# Patient Record
Sex: Female | Born: 1950 | Race: White | Hispanic: No | Marital: Single | Smoking: Never smoker
Health system: Southern US, Community
[De-identification: ages and names within clinical notes are randomized; demographics above are authoritative.]

## PROBLEM LIST (undated history)

## (undated) DIAGNOSIS — E785 Hyperlipidemia, unspecified: Secondary | ICD-10-CM

## (undated) DIAGNOSIS — M810 Age-related osteoporosis without current pathological fracture: Secondary | ICD-10-CM

## (undated) DIAGNOSIS — M109 Gout, unspecified: Secondary | ICD-10-CM

## (undated) DIAGNOSIS — E079 Disorder of thyroid, unspecified: Secondary | ICD-10-CM

## (undated) DIAGNOSIS — E669 Obesity, unspecified: Secondary | ICD-10-CM

## (undated) DIAGNOSIS — M21539 Acquired clawfoot, unspecified foot: Secondary | ICD-10-CM

## (undated) DIAGNOSIS — I10 Essential (primary) hypertension: Secondary | ICD-10-CM

## (undated) DIAGNOSIS — M1990 Unspecified osteoarthritis, unspecified site: Secondary | ICD-10-CM

## (undated) DIAGNOSIS — K859 Acute pancreatitis without necrosis or infection, unspecified: Secondary | ICD-10-CM

## (undated) DIAGNOSIS — K8020 Calculus of gallbladder without cholecystitis without obstruction: Secondary | ICD-10-CM

## (undated) DIAGNOSIS — E119 Type 2 diabetes mellitus without complications: Secondary | ICD-10-CM

## (undated) DIAGNOSIS — M75100 Unspecified rotator cuff tear or rupture of unspecified shoulder, not specified as traumatic: Secondary | ICD-10-CM

## (undated) DIAGNOSIS — M1991 Primary osteoarthritis, unspecified site: Secondary | ICD-10-CM

## (undated) DIAGNOSIS — I119 Hypertensive heart disease without heart failure: Secondary | ICD-10-CM

## (undated) DIAGNOSIS — N649 Disorder of breast, unspecified: Secondary | ICD-10-CM

## (undated) HISTORY — DX: Acquired clawfoot, unspecified foot: M21.539

## (undated) HISTORY — DX: Hypertensive heart disease without heart failure: I11.9

## (undated) HISTORY — DX: Disorder of breast, unspecified: N64.9

## (undated) HISTORY — DX: Hyperlipidemia, unspecified: E78.5

## (undated) HISTORY — DX: Obesity, unspecified: E66.9

## (undated) HISTORY — DX: Age-related osteoporosis without current pathological fracture: M81.0

## (undated) HISTORY — DX: Unspecified rotator cuff tear or rupture of unspecified shoulder, not specified as traumatic: M75.100

## (undated) HISTORY — DX: Hypercalcemia: E83.52

## (undated) HISTORY — DX: Primary osteoarthritis, unspecified site: M19.91

## (undated) HISTORY — PX: OTHER SURGICAL HISTORY: SHX169

---

## 1976-10-21 HISTORY — PX: APPENDECTOMY: SHX54

## 1978-10-21 HISTORY — PX: NASAL SEPTOPLASTY W/ TURBINOPLASTY: SHX2070

## 1996-10-21 HISTORY — PX: CHOLECYSTECTOMY: SHX55

## 1998-04-08 ENCOUNTER — Emergency Department (HOSPITAL_COMMUNITY): Admission: EM | Admit: 1998-04-08 | Discharge: 1998-04-08 | Payer: Self-pay | Admitting: Emergency Medicine

## 1999-09-05 ENCOUNTER — Other Ambulatory Visit: Admission: RE | Admit: 1999-09-05 | Discharge: 1999-09-05 | Payer: Self-pay | Admitting: *Deleted

## 2000-01-16 ENCOUNTER — Inpatient Hospital Stay (HOSPITAL_COMMUNITY): Admission: EM | Admit: 2000-01-16 | Discharge: 2000-01-21 | Payer: Self-pay | Admitting: *Deleted

## 2000-01-25 ENCOUNTER — Observation Stay (HOSPITAL_COMMUNITY): Admission: RE | Admit: 2000-01-25 | Discharge: 2000-01-26 | Payer: Self-pay | Admitting: General Surgery

## 2000-01-25 ENCOUNTER — Encounter: Payer: Self-pay | Admitting: General Surgery

## 2000-02-02 ENCOUNTER — Emergency Department (HOSPITAL_COMMUNITY): Admission: EM | Admit: 2000-02-02 | Discharge: 2000-02-02 | Payer: Self-pay | Admitting: Emergency Medicine

## 2000-12-18 ENCOUNTER — Observation Stay (HOSPITAL_COMMUNITY): Admission: EM | Admit: 2000-12-18 | Discharge: 2000-12-18 | Payer: Self-pay | Admitting: Emergency Medicine

## 2000-12-18 ENCOUNTER — Encounter: Payer: Self-pay | Admitting: Emergency Medicine

## 2001-01-19 ENCOUNTER — Encounter: Admission: RE | Admit: 2001-01-19 | Discharge: 2001-01-19 | Payer: Self-pay | Admitting: Internal Medicine

## 2003-03-22 HISTORY — PX: REPLACEMENT TOTAL KNEE: SUR1224

## 2015-07-22 HISTORY — PX: OTHER SURGICAL HISTORY: SHX169

## 2015-10-22 HISTORY — PX: JOINT REPLACEMENT: SHX530

## 2016-06-27 DIAGNOSIS — M150 Primary generalized (osteo)arthritis: Secondary | ICD-10-CM | POA: Diagnosis not present

## 2016-06-27 DIAGNOSIS — M7662 Achilles tendinitis, left leg: Secondary | ICD-10-CM | POA: Diagnosis not present

## 2016-06-27 DIAGNOSIS — M2550 Pain in unspecified joint: Secondary | ICD-10-CM | POA: Diagnosis not present

## 2016-06-27 DIAGNOSIS — E790 Hyperuricemia without signs of inflammatory arthritis and tophaceous disease: Secondary | ICD-10-CM | POA: Diagnosis not present

## 2016-06-27 DIAGNOSIS — M0609 Rheumatoid arthritis without rheumatoid factor, multiple sites: Secondary | ICD-10-CM | POA: Diagnosis not present

## 2016-09-07 DIAGNOSIS — M79642 Pain in left hand: Secondary | ICD-10-CM | POA: Diagnosis not present

## 2016-09-07 DIAGNOSIS — M19041 Primary osteoarthritis, right hand: Secondary | ICD-10-CM | POA: Diagnosis not present

## 2016-09-07 DIAGNOSIS — M654 Radial styloid tenosynovitis [de Quervain]: Secondary | ICD-10-CM | POA: Diagnosis not present

## 2016-09-07 DIAGNOSIS — M1812 Unilateral primary osteoarthritis of first carpometacarpal joint, left hand: Secondary | ICD-10-CM | POA: Diagnosis not present

## 2016-12-26 DIAGNOSIS — E669 Obesity, unspecified: Secondary | ICD-10-CM | POA: Diagnosis not present

## 2016-12-26 DIAGNOSIS — M150 Primary generalized (osteo)arthritis: Secondary | ICD-10-CM | POA: Diagnosis not present

## 2016-12-26 DIAGNOSIS — M7662 Achilles tendinitis, left leg: Secondary | ICD-10-CM | POA: Diagnosis not present

## 2016-12-26 DIAGNOSIS — Z6835 Body mass index (BMI) 35.0-35.9, adult: Secondary | ICD-10-CM | POA: Diagnosis not present

## 2016-12-26 DIAGNOSIS — M2550 Pain in unspecified joint: Secondary | ICD-10-CM | POA: Diagnosis not present

## 2016-12-26 DIAGNOSIS — E790 Hyperuricemia without signs of inflammatory arthritis and tophaceous disease: Secondary | ICD-10-CM | POA: Diagnosis not present

## 2016-12-26 DIAGNOSIS — M0609 Rheumatoid arthritis without rheumatoid factor, multiple sites: Secondary | ICD-10-CM | POA: Diagnosis not present

## 2017-01-08 DIAGNOSIS — M25341 Other instability, right hand: Secondary | ICD-10-CM | POA: Diagnosis not present

## 2017-01-08 DIAGNOSIS — M19042 Primary osteoarthritis, left hand: Secondary | ICD-10-CM | POA: Diagnosis not present

## 2017-01-08 DIAGNOSIS — M19041 Primary osteoarthritis, right hand: Secondary | ICD-10-CM | POA: Diagnosis not present

## 2017-01-08 DIAGNOSIS — M1812 Unilateral primary osteoarthritis of first carpometacarpal joint, left hand: Secondary | ICD-10-CM | POA: Diagnosis not present

## 2017-03-26 DIAGNOSIS — M0609 Rheumatoid arthritis without rheumatoid factor, multiple sites: Secondary | ICD-10-CM | POA: Diagnosis not present

## 2017-03-26 DIAGNOSIS — M7662 Achilles tendinitis, left leg: Secondary | ICD-10-CM | POA: Diagnosis not present

## 2017-03-26 DIAGNOSIS — M150 Primary generalized (osteo)arthritis: Secondary | ICD-10-CM | POA: Diagnosis not present

## 2017-03-26 DIAGNOSIS — M2550 Pain in unspecified joint: Secondary | ICD-10-CM | POA: Diagnosis not present

## 2017-03-26 DIAGNOSIS — E790 Hyperuricemia without signs of inflammatory arthritis and tophaceous disease: Secondary | ICD-10-CM | POA: Diagnosis not present

## 2017-03-26 DIAGNOSIS — E669 Obesity, unspecified: Secondary | ICD-10-CM | POA: Diagnosis not present

## 2017-03-26 DIAGNOSIS — Z6835 Body mass index (BMI) 35.0-35.9, adult: Secondary | ICD-10-CM | POA: Diagnosis not present

## 2017-03-28 DIAGNOSIS — M0609 Rheumatoid arthritis without rheumatoid factor, multiple sites: Secondary | ICD-10-CM | POA: Diagnosis not present

## 2017-04-25 DIAGNOSIS — M7661 Achilles tendinitis, right leg: Secondary | ICD-10-CM | POA: Diagnosis not present

## 2017-05-05 DIAGNOSIS — M25571 Pain in right ankle and joints of right foot: Secondary | ICD-10-CM | POA: Diagnosis not present

## 2017-05-07 DIAGNOSIS — M25571 Pain in right ankle and joints of right foot: Secondary | ICD-10-CM | POA: Diagnosis not present

## 2017-05-09 DIAGNOSIS — M25571 Pain in right ankle and joints of right foot: Secondary | ICD-10-CM | POA: Diagnosis not present

## 2017-05-12 DIAGNOSIS — M25571 Pain in right ankle and joints of right foot: Secondary | ICD-10-CM | POA: Diagnosis not present

## 2017-05-14 DIAGNOSIS — M25571 Pain in right ankle and joints of right foot: Secondary | ICD-10-CM | POA: Diagnosis not present

## 2017-05-16 DIAGNOSIS — M25571 Pain in right ankle and joints of right foot: Secondary | ICD-10-CM | POA: Diagnosis not present

## 2017-05-16 DIAGNOSIS — M7662 Achilles tendinitis, left leg: Secondary | ICD-10-CM | POA: Diagnosis not present

## 2017-05-19 DIAGNOSIS — M25571 Pain in right ankle and joints of right foot: Secondary | ICD-10-CM | POA: Diagnosis not present

## 2017-05-21 DIAGNOSIS — M25571 Pain in right ankle and joints of right foot: Secondary | ICD-10-CM | POA: Diagnosis not present

## 2017-05-23 DIAGNOSIS — M25571 Pain in right ankle and joints of right foot: Secondary | ICD-10-CM | POA: Diagnosis not present

## 2017-05-26 DIAGNOSIS — M25571 Pain in right ankle and joints of right foot: Secondary | ICD-10-CM | POA: Diagnosis not present

## 2017-05-26 DIAGNOSIS — E669 Obesity, unspecified: Secondary | ICD-10-CM | POA: Diagnosis not present

## 2017-05-26 DIAGNOSIS — E790 Hyperuricemia without signs of inflammatory arthritis and tophaceous disease: Secondary | ICD-10-CM | POA: Diagnosis not present

## 2017-05-26 DIAGNOSIS — M79674 Pain in right toe(s): Secondary | ICD-10-CM | POA: Diagnosis not present

## 2017-05-26 DIAGNOSIS — M150 Primary generalized (osteo)arthritis: Secondary | ICD-10-CM | POA: Diagnosis not present

## 2017-05-26 DIAGNOSIS — Z6834 Body mass index (BMI) 34.0-34.9, adult: Secondary | ICD-10-CM | POA: Diagnosis not present

## 2017-05-26 DIAGNOSIS — M2550 Pain in unspecified joint: Secondary | ICD-10-CM | POA: Diagnosis not present

## 2017-05-26 DIAGNOSIS — M0609 Rheumatoid arthritis without rheumatoid factor, multiple sites: Secondary | ICD-10-CM | POA: Diagnosis not present

## 2017-05-26 DIAGNOSIS — M7662 Achilles tendinitis, left leg: Secondary | ICD-10-CM | POA: Diagnosis not present

## 2017-05-28 DIAGNOSIS — M25571 Pain in right ankle and joints of right foot: Secondary | ICD-10-CM | POA: Diagnosis not present

## 2017-05-30 DIAGNOSIS — M25571 Pain in right ankle and joints of right foot: Secondary | ICD-10-CM | POA: Diagnosis not present

## 2017-06-02 DIAGNOSIS — M25571 Pain in right ankle and joints of right foot: Secondary | ICD-10-CM | POA: Diagnosis not present

## 2017-06-06 DIAGNOSIS — M25571 Pain in right ankle and joints of right foot: Secondary | ICD-10-CM | POA: Diagnosis not present

## 2017-06-09 DIAGNOSIS — M25571 Pain in right ankle and joints of right foot: Secondary | ICD-10-CM | POA: Diagnosis not present

## 2017-06-11 DIAGNOSIS — M25571 Pain in right ankle and joints of right foot: Secondary | ICD-10-CM | POA: Diagnosis not present

## 2017-06-13 DIAGNOSIS — M25571 Pain in right ankle and joints of right foot: Secondary | ICD-10-CM | POA: Diagnosis not present

## 2017-06-27 DIAGNOSIS — M7662 Achilles tendinitis, left leg: Secondary | ICD-10-CM | POA: Diagnosis not present

## 2017-06-27 DIAGNOSIS — M7661 Achilles tendinitis, right leg: Secondary | ICD-10-CM | POA: Diagnosis not present

## 2017-06-30 DIAGNOSIS — M0609 Rheumatoid arthritis without rheumatoid factor, multiple sites: Secondary | ICD-10-CM | POA: Diagnosis not present

## 2017-06-30 DIAGNOSIS — M150 Primary generalized (osteo)arthritis: Secondary | ICD-10-CM | POA: Diagnosis not present

## 2017-06-30 DIAGNOSIS — E790 Hyperuricemia without signs of inflammatory arthritis and tophaceous disease: Secondary | ICD-10-CM | POA: Diagnosis not present

## 2017-06-30 DIAGNOSIS — Z6835 Body mass index (BMI) 35.0-35.9, adult: Secondary | ICD-10-CM | POA: Diagnosis not present

## 2017-06-30 DIAGNOSIS — M2550 Pain in unspecified joint: Secondary | ICD-10-CM | POA: Diagnosis not present

## 2017-06-30 DIAGNOSIS — M7662 Achilles tendinitis, left leg: Secondary | ICD-10-CM | POA: Diagnosis not present

## 2017-06-30 DIAGNOSIS — M79674 Pain in right toe(s): Secondary | ICD-10-CM | POA: Diagnosis not present

## 2017-06-30 DIAGNOSIS — E669 Obesity, unspecified: Secondary | ICD-10-CM | POA: Diagnosis not present

## 2017-12-29 DIAGNOSIS — M2550 Pain in unspecified joint: Secondary | ICD-10-CM | POA: Diagnosis not present

## 2017-12-29 DIAGNOSIS — M0609 Rheumatoid arthritis without rheumatoid factor, multiple sites: Secondary | ICD-10-CM | POA: Diagnosis not present

## 2017-12-29 DIAGNOSIS — E790 Hyperuricemia without signs of inflammatory arthritis and tophaceous disease: Secondary | ICD-10-CM | POA: Diagnosis not present

## 2017-12-29 DIAGNOSIS — E669 Obesity, unspecified: Secondary | ICD-10-CM | POA: Diagnosis not present

## 2017-12-29 DIAGNOSIS — M7662 Achilles tendinitis, left leg: Secondary | ICD-10-CM | POA: Diagnosis not present

## 2017-12-29 DIAGNOSIS — Z6835 Body mass index (BMI) 35.0-35.9, adult: Secondary | ICD-10-CM | POA: Diagnosis not present

## 2017-12-29 DIAGNOSIS — M150 Primary generalized (osteo)arthritis: Secondary | ICD-10-CM | POA: Diagnosis not present

## 2018-02-18 ENCOUNTER — Other Ambulatory Visit: Payer: Self-pay | Admitting: Internal Medicine

## 2018-02-18 DIAGNOSIS — M1990 Unspecified osteoarthritis, unspecified site: Secondary | ICD-10-CM

## 2018-02-18 DIAGNOSIS — M25512 Pain in left shoulder: Secondary | ICD-10-CM

## 2018-03-09 DIAGNOSIS — M25522 Pain in left elbow: Secondary | ICD-10-CM | POA: Diagnosis not present

## 2018-03-09 DIAGNOSIS — S52132A Displaced fracture of neck of left radius, initial encounter for closed fracture: Secondary | ICD-10-CM | POA: Diagnosis not present

## 2018-03-09 DIAGNOSIS — M25532 Pain in left wrist: Secondary | ICD-10-CM | POA: Diagnosis not present

## 2018-03-11 ENCOUNTER — Ambulatory Visit
Admission: RE | Admit: 2018-03-11 | Discharge: 2018-03-11 | Disposition: A | Discharge status: Home / Self Care | Payer: Self-pay | Source: Ambulatory Visit | Attending: Internal Medicine | Admitting: Internal Medicine

## 2018-03-11 DIAGNOSIS — M19012 Primary osteoarthritis, left shoulder: Secondary | ICD-10-CM | POA: Diagnosis not present

## 2018-03-11 DIAGNOSIS — M25532 Pain in left wrist: Secondary | ICD-10-CM | POA: Diagnosis not present

## 2018-03-11 DIAGNOSIS — M25512 Pain in left shoulder: Secondary | ICD-10-CM

## 2018-03-11 DIAGNOSIS — M1990 Unspecified osteoarthritis, unspecified site: Secondary | ICD-10-CM

## 2018-03-11 DIAGNOSIS — S52135D Nondisplaced fracture of neck of left radius, subsequent encounter for closed fracture with routine healing: Secondary | ICD-10-CM | POA: Diagnosis not present

## 2018-03-11 DIAGNOSIS — M25522 Pain in left elbow: Secondary | ICD-10-CM | POA: Diagnosis not present

## 2018-03-25 DIAGNOSIS — M25522 Pain in left elbow: Secondary | ICD-10-CM | POA: Diagnosis not present

## 2018-03-25 DIAGNOSIS — M25532 Pain in left wrist: Secondary | ICD-10-CM | POA: Diagnosis not present

## 2018-03-25 DIAGNOSIS — S52135D Nondisplaced fracture of neck of left radius, subsequent encounter for closed fracture with routine healing: Secondary | ICD-10-CM | POA: Diagnosis not present

## 2018-03-30 DIAGNOSIS — M25522 Pain in left elbow: Secondary | ICD-10-CM | POA: Diagnosis not present

## 2018-03-30 DIAGNOSIS — M25532 Pain in left wrist: Secondary | ICD-10-CM | POA: Diagnosis not present

## 2018-03-30 DIAGNOSIS — S52135D Nondisplaced fracture of neck of left radius, subsequent encounter for closed fracture with routine healing: Secondary | ICD-10-CM | POA: Diagnosis not present

## 2018-03-30 DIAGNOSIS — S52133A Displaced fracture of neck of unspecified radius, initial encounter for closed fracture: Secondary | ICD-10-CM | POA: Insufficient documentation

## 2018-04-01 DIAGNOSIS — M25532 Pain in left wrist: Secondary | ICD-10-CM | POA: Diagnosis not present

## 2018-04-01 DIAGNOSIS — M25522 Pain in left elbow: Secondary | ICD-10-CM | POA: Diagnosis not present

## 2018-04-01 DIAGNOSIS — S52135D Nondisplaced fracture of neck of left radius, subsequent encounter for closed fracture with routine healing: Secondary | ICD-10-CM | POA: Diagnosis not present

## 2018-04-06 DIAGNOSIS — S52135D Nondisplaced fracture of neck of left radius, subsequent encounter for closed fracture with routine healing: Secondary | ICD-10-CM | POA: Diagnosis not present

## 2018-04-06 DIAGNOSIS — M25522 Pain in left elbow: Secondary | ICD-10-CM | POA: Diagnosis not present

## 2018-04-06 DIAGNOSIS — M25532 Pain in left wrist: Secondary | ICD-10-CM | POA: Diagnosis not present

## 2018-04-08 DIAGNOSIS — S52135D Nondisplaced fracture of neck of left radius, subsequent encounter for closed fracture with routine healing: Secondary | ICD-10-CM | POA: Diagnosis not present

## 2018-04-08 DIAGNOSIS — M25532 Pain in left wrist: Secondary | ICD-10-CM | POA: Diagnosis not present

## 2018-04-08 DIAGNOSIS — M25522 Pain in left elbow: Secondary | ICD-10-CM | POA: Diagnosis not present

## 2018-04-13 DIAGNOSIS — M25522 Pain in left elbow: Secondary | ICD-10-CM | POA: Diagnosis not present

## 2018-04-13 DIAGNOSIS — S52135D Nondisplaced fracture of neck of left radius, subsequent encounter for closed fracture with routine healing: Secondary | ICD-10-CM | POA: Diagnosis not present

## 2018-04-13 DIAGNOSIS — M25532 Pain in left wrist: Secondary | ICD-10-CM | POA: Diagnosis not present

## 2018-04-15 DIAGNOSIS — M25522 Pain in left elbow: Secondary | ICD-10-CM | POA: Diagnosis not present

## 2018-04-15 DIAGNOSIS — S52135D Nondisplaced fracture of neck of left radius, subsequent encounter for closed fracture with routine healing: Secondary | ICD-10-CM | POA: Diagnosis not present

## 2018-04-15 DIAGNOSIS — M25532 Pain in left wrist: Secondary | ICD-10-CM | POA: Diagnosis not present

## 2018-04-22 DIAGNOSIS — M25522 Pain in left elbow: Secondary | ICD-10-CM | POA: Diagnosis not present

## 2018-04-29 DIAGNOSIS — M25532 Pain in left wrist: Secondary | ICD-10-CM | POA: Diagnosis not present

## 2018-04-29 DIAGNOSIS — M25522 Pain in left elbow: Secondary | ICD-10-CM | POA: Diagnosis not present

## 2018-04-29 DIAGNOSIS — S52135D Nondisplaced fracture of neck of left radius, subsequent encounter for closed fracture with routine healing: Secondary | ICD-10-CM | POA: Diagnosis not present

## 2018-05-04 DIAGNOSIS — M25522 Pain in left elbow: Secondary | ICD-10-CM | POA: Diagnosis not present

## 2018-05-04 DIAGNOSIS — S52135D Nondisplaced fracture of neck of left radius, subsequent encounter for closed fracture with routine healing: Secondary | ICD-10-CM | POA: Diagnosis not present

## 2018-05-04 DIAGNOSIS — M25532 Pain in left wrist: Secondary | ICD-10-CM | POA: Diagnosis not present

## 2018-05-06 DIAGNOSIS — M25522 Pain in left elbow: Secondary | ICD-10-CM | POA: Diagnosis not present

## 2018-05-06 DIAGNOSIS — S52135D Nondisplaced fracture of neck of left radius, subsequent encounter for closed fracture with routine healing: Secondary | ICD-10-CM | POA: Diagnosis not present

## 2018-05-06 DIAGNOSIS — M25532 Pain in left wrist: Secondary | ICD-10-CM | POA: Diagnosis not present

## 2018-05-11 DIAGNOSIS — S52135D Nondisplaced fracture of neck of left radius, subsequent encounter for closed fracture with routine healing: Secondary | ICD-10-CM | POA: Diagnosis not present

## 2018-05-11 DIAGNOSIS — M25522 Pain in left elbow: Secondary | ICD-10-CM | POA: Diagnosis not present

## 2018-05-11 DIAGNOSIS — M25532 Pain in left wrist: Secondary | ICD-10-CM | POA: Diagnosis not present

## 2018-05-13 DIAGNOSIS — M25532 Pain in left wrist: Secondary | ICD-10-CM | POA: Diagnosis not present

## 2018-05-13 DIAGNOSIS — M25522 Pain in left elbow: Secondary | ICD-10-CM | POA: Diagnosis not present

## 2018-05-13 DIAGNOSIS — S52135D Nondisplaced fracture of neck of left radius, subsequent encounter for closed fracture with routine healing: Secondary | ICD-10-CM | POA: Diagnosis not present

## 2018-05-18 DIAGNOSIS — S52135D Nondisplaced fracture of neck of left radius, subsequent encounter for closed fracture with routine healing: Secondary | ICD-10-CM | POA: Diagnosis not present

## 2018-05-18 DIAGNOSIS — M25522 Pain in left elbow: Secondary | ICD-10-CM | POA: Diagnosis not present

## 2018-05-18 DIAGNOSIS — M25532 Pain in left wrist: Secondary | ICD-10-CM | POA: Diagnosis not present

## 2018-05-21 DIAGNOSIS — M25522 Pain in left elbow: Secondary | ICD-10-CM | POA: Diagnosis not present

## 2018-05-21 DIAGNOSIS — M25532 Pain in left wrist: Secondary | ICD-10-CM | POA: Diagnosis not present

## 2018-05-21 DIAGNOSIS — S52135D Nondisplaced fracture of neck of left radius, subsequent encounter for closed fracture with routine healing: Secondary | ICD-10-CM | POA: Diagnosis not present

## 2018-05-25 DIAGNOSIS — M25532 Pain in left wrist: Secondary | ICD-10-CM | POA: Diagnosis not present

## 2018-05-25 DIAGNOSIS — M25522 Pain in left elbow: Secondary | ICD-10-CM | POA: Diagnosis not present

## 2018-05-25 DIAGNOSIS — S52135D Nondisplaced fracture of neck of left radius, subsequent encounter for closed fracture with routine healing: Secondary | ICD-10-CM | POA: Diagnosis not present

## 2018-07-01 DIAGNOSIS — M2550 Pain in unspecified joint: Secondary | ICD-10-CM | POA: Diagnosis not present

## 2018-07-01 DIAGNOSIS — Z6835 Body mass index (BMI) 35.0-35.9, adult: Secondary | ICD-10-CM | POA: Diagnosis not present

## 2018-07-01 DIAGNOSIS — E790 Hyperuricemia without signs of inflammatory arthritis and tophaceous disease: Secondary | ICD-10-CM | POA: Diagnosis not present

## 2018-07-01 DIAGNOSIS — M150 Primary generalized (osteo)arthritis: Secondary | ICD-10-CM | POA: Diagnosis not present

## 2018-07-01 DIAGNOSIS — E669 Obesity, unspecified: Secondary | ICD-10-CM | POA: Diagnosis not present

## 2018-07-01 DIAGNOSIS — M0609 Rheumatoid arthritis without rheumatoid factor, multiple sites: Secondary | ICD-10-CM | POA: Diagnosis not present

## 2018-07-01 DIAGNOSIS — M7662 Achilles tendinitis, left leg: Secondary | ICD-10-CM | POA: Diagnosis not present

## 2018-07-23 DIAGNOSIS — M0609 Rheumatoid arthritis without rheumatoid factor, multiple sites: Secondary | ICD-10-CM | POA: Diagnosis not present

## 2018-07-23 DIAGNOSIS — M2550 Pain in unspecified joint: Secondary | ICD-10-CM | POA: Diagnosis not present

## 2018-07-23 DIAGNOSIS — M7662 Achilles tendinitis, left leg: Secondary | ICD-10-CM | POA: Diagnosis not present

## 2018-07-23 DIAGNOSIS — E790 Hyperuricemia without signs of inflammatory arthritis and tophaceous disease: Secondary | ICD-10-CM | POA: Diagnosis not present

## 2018-07-23 DIAGNOSIS — M79671 Pain in right foot: Secondary | ICD-10-CM | POA: Diagnosis not present

## 2018-07-23 DIAGNOSIS — M150 Primary generalized (osteo)arthritis: Secondary | ICD-10-CM | POA: Diagnosis not present

## 2019-09-27 DIAGNOSIS — M109 Gout, unspecified: Secondary | ICD-10-CM | POA: Insufficient documentation

## 2019-10-26 HISTORY — PX: OTHER SURGICAL HISTORY: SHX169

## 2019-10-30 ENCOUNTER — Emergency Department (HOSPITAL_COMMUNITY): Payer: Medicare PPO

## 2019-10-30 ENCOUNTER — Inpatient Hospital Stay (HOSPITAL_COMMUNITY)
Admission: EM | Admit: 2019-10-30 | Discharge: 2019-11-03 | DRG: 069 | Disposition: A | Discharge status: Home Health | Payer: Medicare PPO | Attending: Internal Medicine | Admitting: Internal Medicine

## 2019-10-30 ENCOUNTER — Ambulatory Visit (HOSPITAL_COMMUNITY)
Admission: EM | Admit: 2019-10-30 | Discharge: 2019-10-30 | Disposition: A | Discharge status: Home / Self Care | Payer: Self-pay

## 2019-10-30 ENCOUNTER — Encounter (HOSPITAL_COMMUNITY): Payer: Self-pay

## 2019-10-30 ENCOUNTER — Other Ambulatory Visit: Payer: Self-pay

## 2019-10-30 DIAGNOSIS — E785 Hyperlipidemia, unspecified: Secondary | ICD-10-CM | POA: Diagnosis present

## 2019-10-30 DIAGNOSIS — R202 Paresthesia of skin: Secondary | ICD-10-CM

## 2019-10-30 DIAGNOSIS — Z20822 Contact with and (suspected) exposure to covid-19: Secondary | ICD-10-CM | POA: Diagnosis present

## 2019-10-30 DIAGNOSIS — E1159 Type 2 diabetes mellitus with other circulatory complications: Secondary | ICD-10-CM

## 2019-10-30 DIAGNOSIS — M50222 Other cervical disc displacement at C5-C6 level: Secondary | ICD-10-CM | POA: Diagnosis present

## 2019-10-30 DIAGNOSIS — Z882 Allergy status to sulfonamides status: Secondary | ICD-10-CM

## 2019-10-30 DIAGNOSIS — R260 Ataxic gait: Secondary | ICD-10-CM | POA: Diagnosis present

## 2019-10-30 DIAGNOSIS — M069 Rheumatoid arthritis, unspecified: Secondary | ICD-10-CM | POA: Diagnosis present

## 2019-10-30 DIAGNOSIS — M1990 Unspecified osteoarthritis, unspecified site: Secondary | ICD-10-CM | POA: Diagnosis present

## 2019-10-30 DIAGNOSIS — Z886 Allergy status to analgesic agent status: Secondary | ICD-10-CM

## 2019-10-30 DIAGNOSIS — M4802 Spinal stenosis, cervical region: Secondary | ICD-10-CM | POA: Diagnosis present

## 2019-10-30 DIAGNOSIS — M47812 Spondylosis without myelopathy or radiculopathy, cervical region: Secondary | ICD-10-CM | POA: Diagnosis present

## 2019-10-30 DIAGNOSIS — R519 Headache, unspecified: Secondary | ICD-10-CM | POA: Diagnosis present

## 2019-10-30 DIAGNOSIS — R42 Dizziness and giddiness: Secondary | ICD-10-CM

## 2019-10-30 DIAGNOSIS — M109 Gout, unspecified: Secondary | ICD-10-CM | POA: Diagnosis present

## 2019-10-30 DIAGNOSIS — Z885 Allergy status to narcotic agent status: Secondary | ICD-10-CM

## 2019-10-30 DIAGNOSIS — R200 Anesthesia of skin: Secondary | ICD-10-CM | POA: Diagnosis not present

## 2019-10-30 DIAGNOSIS — G43909 Migraine, unspecified, not intractable, without status migrainosus: Secondary | ICD-10-CM | POA: Diagnosis present

## 2019-10-30 DIAGNOSIS — I10 Essential (primary) hypertension: Secondary | ICD-10-CM | POA: Diagnosis present

## 2019-10-30 DIAGNOSIS — Z888 Allergy status to other drugs, medicaments and biological substances status: Secondary | ICD-10-CM

## 2019-10-30 DIAGNOSIS — R262 Difficulty in walking, not elsewhere classified: Secondary | ICD-10-CM | POA: Diagnosis present

## 2019-10-30 DIAGNOSIS — Z79890 Hormone replacement therapy (postmenopausal): Secondary | ICD-10-CM

## 2019-10-30 DIAGNOSIS — G459 Transient cerebral ischemic attack, unspecified: Principal | ICD-10-CM | POA: Diagnosis present

## 2019-10-30 DIAGNOSIS — I152 Hypertension secondary to endocrine disorders: Secondary | ICD-10-CM

## 2019-10-30 DIAGNOSIS — E039 Hypothyroidism, unspecified: Secondary | ICD-10-CM | POA: Diagnosis present

## 2019-10-30 DIAGNOSIS — E119 Type 2 diabetes mellitus without complications: Secondary | ICD-10-CM | POA: Diagnosis present

## 2019-10-30 DIAGNOSIS — Z881 Allergy status to other antibiotic agents status: Secondary | ICD-10-CM

## 2019-10-30 DIAGNOSIS — R270 Ataxia, unspecified: Secondary | ICD-10-CM

## 2019-10-30 DIAGNOSIS — Z880 Allergy status to penicillin: Secondary | ICD-10-CM

## 2019-10-30 HISTORY — DX: Type 2 diabetes mellitus without complications: E11.9

## 2019-10-30 HISTORY — DX: Disorder of thyroid, unspecified: E07.9

## 2019-10-30 HISTORY — DX: Gout, unspecified: M10.9

## 2019-10-30 HISTORY — DX: Acute pancreatitis without necrosis or infection, unspecified: K85.90

## 2019-10-30 HISTORY — DX: Essential (primary) hypertension: I10

## 2019-10-30 HISTORY — DX: Unspecified osteoarthritis, unspecified site: M19.90

## 2019-10-30 HISTORY — DX: Calculus of gallbladder without cholecystitis without obstruction: K80.20

## 2019-10-30 LAB — ETHANOL: Alcohol, Ethyl (B): <10 mg/dL (ref <10)

## 2019-10-30 LAB — CBC WITH DIFFERENTIAL/PLATELET
Abs Immature Granulocytes: 0.05 10*3/uL (ref 0.00–0.07)
Basophils Absolute: 0.1 10*3/uL (ref 0.0–0.1)
Basophils Relative: 1 %
Eosinophils Absolute: 0.2 10*3/uL (ref 0.0–0.5)
Eosinophils Relative: 2 %
HCT: 44.8 % (ref 36.0–46.0)
Hemoglobin: 14.6 g/dL (ref 12.0–15.0)
Immature Granulocytes: 1 %
Lymphocytes Relative: 16 %
Lymphs Abs: 1.5 10*3/uL (ref 0.7–4.0)
MCH: 28.7 pg (ref 26.0–34.0)
MCHC: 32.6 g/dL (ref 30.0–36.0)
MCV: 88.2 fL (ref 80.0–100.0)
Monocytes Absolute: 0.6 10*3/uL (ref 0.1–1.0)
Monocytes Relative: 6 %
Neutro Abs: 6.8 10*3/uL (ref 1.7–7.7)
Neutrophils Relative %: 74 %
Platelets: 235 10*3/uL (ref 150–400)
RBC: 5.08 MIL/uL (ref 3.87–5.11)
RDW: 12.8 % (ref 11.5–15.5)
WBC: 9.1 10*3/uL (ref 4.0–10.5)
nRBC: 0 % (ref 0.0–0.2)

## 2019-10-30 LAB — URINALYSIS, ROUTINE W REFLEX MICROSCOPIC
Bacteria, UA: NONE SEEN
Bilirubin Urine: NEGATIVE
Glucose, UA: NEGATIVE mg/dL
Ketones, ur: NEGATIVE mg/dL
Nitrite: POSITIVE — AB
Protein, ur: 30 mg/dL — AB
Specific Gravity, Urine: 1.016 (ref 1.005–1.030)
pH: 5 (ref 5.0–8.0)

## 2019-10-30 LAB — PROTIME-INR
INR: 1 (ref 0.8–1.2)
Prothrombin Time: 12.7 seconds (ref 11.4–15.2)

## 2019-10-30 LAB — I-STAT CHEM 8, ED
BUN: 14 mg/dL (ref 8–23)
Calcium, Ion: 1.14 mmol/L — ABNORMAL LOW (ref 1.15–1.40)
Chloride: 98 mmol/L (ref 98–111)
Creatinine, Ser: 0.7 mg/dL (ref 0.44–1.00)
Glucose, Bld: 134 mg/dL — ABNORMAL HIGH (ref 70–99)
HCT: 45 % (ref 36.0–46.0)
Hemoglobin: 15.3 g/dL — ABNORMAL HIGH (ref 12.0–15.0)
Potassium: 3.9 mmol/L (ref 3.5–5.1)
Sodium: 136 mmol/L (ref 135–145)
TCO2: 29 mmol/L (ref 22–32)

## 2019-10-30 LAB — APTT: aPTT: 28 seconds (ref 24–36)

## 2019-10-30 LAB — CBG MONITORING, ED: Glucose-Capillary: 128 mg/dL — ABNORMAL HIGH (ref 70–99)

## 2019-10-30 LAB — COMPREHENSIVE METABOLIC PANEL
ALT: 26 U/L (ref 0–44)
AST: 21 U/L (ref 15–41)
Albumin: 4.2 g/dL (ref 3.5–5.0)
Alkaline Phosphatase: 75 U/L (ref 38–126)
Anion gap: 13 (ref 5–15)
BUN: 12 mg/dL (ref 8–23)
CO2: 27 mmol/L (ref 22–32)
Calcium: 9.7 mg/dL (ref 8.9–10.3)
Chloride: 95 mmol/L — ABNORMAL LOW (ref 98–111)
Creatinine, Ser: 0.72 mg/dL (ref 0.44–1.00)
GFR calc Af Amer: >60 mL/min (ref >60)
GFR calc non Af Amer: >60 mL/min (ref >60)
Glucose, Bld: 139 mg/dL — ABNORMAL HIGH (ref 70–99)
Potassium: 3.7 mmol/L (ref 3.5–5.1)
Sodium: 135 mmol/L (ref 135–145)
Total Bilirubin: 0.8 mg/dL (ref 0.3–1.2)
Total Protein: 8 g/dL (ref 6.5–8.1)

## 2019-10-30 LAB — RAPID URINE DRUG SCREEN, HOSP PERFORMED
Amphetamines: NOT DETECTED
Barbiturates: NOT DETECTED
Benzodiazepines: NOT DETECTED
Cocaine: NOT DETECTED
Opiates: NOT DETECTED
Tetrahydrocannabinol: NOT DETECTED

## 2019-10-30 MED ORDER — MENTHOL 3 MG MT LOZG
1.0000 | LOZENGE | OROMUCOSAL | Status: DC | PRN
  Administered 2019-10-31 – 2019-11-02 (×3): 3 mg via ORAL
  Filled 2019-10-30 (×3): qty 9

## 2019-10-30 MED ORDER — DIPHENHYDRAMINE HCL 50 MG/ML IJ SOLN
25.0000 mg | Freq: Once | INTRAMUSCULAR | Status: AC
  Administered 2019-10-30: 22:00:00 25 mg via INTRAVENOUS
  Filled 2019-10-30: qty 1

## 2019-10-30 MED ORDER — ACETAMINOPHEN 325 MG PO TABS
650.0000 mg | ORAL_TABLET | ORAL | Status: DC | PRN
  Administered 2019-10-31 – 2019-11-03 (×10): 650 mg via ORAL
  Filled 2019-10-30 (×10): qty 2

## 2019-10-30 MED ORDER — STROKE: EARLY STAGES OF RECOVERY BOOK
Freq: Once | Status: AC
  Filled 2019-10-30: qty 1

## 2019-10-30 MED ORDER — IOHEXOL 350 MG/ML SOLN
75.0000 mL | Freq: Once | INTRAVENOUS | Status: AC | PRN
  Administered 2019-10-30: 16:00:00 75 mL via INTRAVENOUS

## 2019-10-30 MED ORDER — ACETAMINOPHEN 500 MG PO TABS
1000.0000 mg | ORAL_TABLET | Freq: Once | ORAL | Status: AC
  Administered 2019-10-30: 1000 mg via ORAL
  Filled 2019-10-30: qty 2

## 2019-10-30 MED ORDER — MUSCLE RUB 10-15 % EX CREA
TOPICAL_CREAM | CUTANEOUS | Status: DC | PRN
  Filled 2019-10-30 (×2): qty 85

## 2019-10-30 MED ORDER — ACETAMINOPHEN 650 MG RE SUPP: 650.0000 mg | RECTAL | Status: DC | PRN

## 2019-10-30 MED ORDER — ALLOPURINOL 100 MG PO TABS
100.0000 mg | ORAL_TABLET | Freq: Every day | ORAL | Status: DC
  Administered 2019-10-31 – 2019-11-03 (×4): 100 mg via ORAL
  Filled 2019-10-30 (×4): qty 1

## 2019-10-30 MED ORDER — COLCHICINE 0.6 MG PO TABS
0.6000 mg | ORAL_TABLET | Freq: Every day | ORAL | Status: DC
  Administered 2019-10-31 – 2019-11-03 (×4): 0.6 mg via ORAL
  Filled 2019-10-30 (×4): qty 1

## 2019-10-30 MED ORDER — ENOXAPARIN SODIUM 40 MG/0.4ML ~~LOC~~ SOLN
40.0000 mg | SUBCUTANEOUS | Status: DC
  Administered 2019-10-31 – 2019-11-02 (×3): 40 mg via SUBCUTANEOUS
  Filled 2019-10-30 (×3): qty 0.4

## 2019-10-30 MED ORDER — SENNOSIDES-DOCUSATE SODIUM 8.6-50 MG PO TABS: 1.0000 | ORAL_TABLET | Freq: Every evening | ORAL | Status: DC | PRN

## 2019-10-30 MED ORDER — ACETAMINOPHEN 500 MG PO TABS: 1000.0000 mg | ORAL_TABLET | Freq: Every day | ORAL | Status: DC | PRN

## 2019-10-30 MED ORDER — EPINEPHRINE 0.3 MG/0.3ML IJ SOAJ
0.3000 mg | Freq: Once | INTRAMUSCULAR | Status: DC | PRN
  Filled 2019-10-30: qty 0.6

## 2019-10-30 MED ORDER — PROCHLORPERAZINE EDISYLATE 10 MG/2ML IJ SOLN
10.0000 mg | Freq: Once | INTRAMUSCULAR | Status: AC
  Administered 2019-10-30: 10 mg via INTRAVENOUS
  Filled 2019-10-30: qty 2

## 2019-10-30 MED ORDER — ACETAMINOPHEN 160 MG/5ML PO SOLN: 650.0000 mg | ORAL | Status: DC | PRN

## 2019-10-30 MED ORDER — VITAMIN D 25 MCG (1000 UNIT) PO TABS
5000.0000 [IU] | ORAL_TABLET | Freq: Every day | ORAL | Status: DC
  Administered 2019-10-31 – 2019-11-02 (×4): 5000 [IU] via ORAL
  Filled 2019-10-30 (×4): qty 5

## 2019-10-30 MED ORDER — LEVOTHYROXINE SODIUM 112 MCG PO TABS
112.0000 ug | ORAL_TABLET | Freq: Every day | ORAL | Status: DC
  Administered 2019-10-31 – 2019-11-03 (×4): 112 ug via ORAL
  Filled 2019-10-30 (×4): qty 1

## 2019-10-30 NOTE — ED Provider Notes (Signed)
[facility] EMERGENCY DEPARTMENT Provider Note   CSN: [id number] Arrival date & time: 10/30/19  1241     History Chief Complaint  Patient presents with  . Weakness    Stroke like sx    Kristy Gill is a 69 y.o. female.  HPI      69 year old female with a history of hyperlipidemia not on medications due to history of reactions, diet-controlled diabetes, left wrist surgery with Dr. Amanda Pea on Tuesday, who presents with concern for headache, right facial numbness, and difficulty walking.  Reports she woke up at 4:30 AM with a right facial headache located around her right eye, which was severe. She took pain medications and went back to bed. At 730AM woke up again, continued to have headache, went back to bed then woke at 10AM with difficulty walking, leaning towards the right, and right facial numbness.   Denies fever, chest pain, dyspnea.  No smoking, etoh, drug use. No hx of CVA or cardiac problems.  Did have hx of migraines in the past, this headache did feel different Reports has had episodes of dizziness in the past which she believes is due to neck spasms.  Past Medical History:  Diagnosis Date  . Arthritis   . Diabetes mellitus without complication (HCC)   . Gout   . Hypertension   . Thyroid disease    hypothryoidism    There are no problems to display for this patient.   History reviewed. No pertinent surgical history.   OB History   No obstetric history on file.     History reviewed. No pertinent family history.  Social History   Tobacco Use  . Smoking status: Never Smoker  . Smokeless tobacco: Never Used  Substance Use Topics  . Alcohol use: Never  . Drug use: Never    Home Medications Prior to Admission medications   Medication Sig Start Date End Date Taking? Authorizing Provider  acetaminophen (TYLENOL) 500 MG tablet Take 1,000 mg by mouth daily as needed (pain).   Yes [provider]  acetaminophen (TYLENOL) 650 MG  CR tablet Take 1,300 mg by mouth at bedtime.   Yes [provider]  allopurinol (ZYLOPRIM) 100 MG tablet Take 100 mg by mouth daily with supper.   Yes [provider]  Cholecalciferol (VITAMIN D-3) 125 MCG (5000 UT) TABS Take 5,000 Units by mouth at bedtime.   Yes [provider]  colchicine 0.6 MG tablet Take 0.6 mg by mouth daily with lunch.   Yes [provider]  Cranberry (CRAN-MAX) 500 MG CAPS Take 500 mg by mouth daily after breakfast.   Yes [provider]  cyclobenzaprine (FLEXERIL) 10 MG tablet Take 10 mg by mouth See admin instructions. Take one tablet (10 mg) by mouth daily at bedtime, may also take one tablet (10 mg) daily as needed for pain   Yes [provider]  diphenhydrAMINE (BENADRYL) 12.5 MG/5ML liquid Take 12.5 mg by mouth daily as needed for itching or allergies.   Yes [provider]  EPINEPHrine (EPIPEN 2-PAK) 0.3 mg/0.3 mL IJ SOAJ injection Inject 0.3 mg into the muscle once as needed for anaphylaxis (severe allergic reaction).   Yes [provider]  levothyroxine (SYNTHROID) 112 MCG tablet Take 112 mcg by mouth daily before breakfast.   Yes [provider]  Polyvinyl Alcohol-Povidone (REFRESH OP) Place 1 drop into both eyes daily as needed (itching/ dry eyes).   Yes [provider]  Probiotic Product (PROBIOTIC PO) Take 1  tablet by mouth 3 (three) times a week.   Yes [provider]  traMADol (ULTRAM) 50 MG tablet Take 50 mg by mouth every 8 (eight) hours as needed (pain).   Yes [provider]  TURMERIC PO Take 1,500 mg by mouth daily.   Yes [provider]    Allergies    Ceclor [cefaclor], Metformin and related, Voltaren [diclofenac], Morphine and related, Amlodipine, Arava [leflunomide], Atorvastatin, Boswellia, Cephalosporins, Clindamycin/lincomycin, Gemfibrozil, Lisinopril, Lodine [etodolac], Medrol [methylprednisolone], Methotrexate derivatives, Niacin  and related, Plaquenil [hydroxychloroquine], Pravastatin, Rayos [prednisone], Sulfa antibiotics, Unasyn [ampicillin-sulbactam sodium], Zocor [simvastatin], Actemra [tocilizumab], Baclofen, Enbrel [etanercept], Ibuprofen, Other, Penicillins, Simponi [golimumab], and Zetia [ezetimibe]  Review of Systems   Review of Systems  Constitutional: Negative for fever.  HENT: Negative for sore throat.   Eyes: Negative for visual disturbance.  Respiratory: Negative for cough and shortness of breath.   Cardiovascular: Negative for chest pain.  Gastrointestinal: Negative for abdominal pain, nausea and vomiting.  Genitourinary: Negative for difficulty urinating.  Musculoskeletal: Positive for gait problem. Negative for back pain and neck pain.  Skin: Negative for rash.  Neurological: Positive for weakness, numbness and headaches. Negative for syncope, facial asymmetry and speech difficulty.    Physical Exam Updated Vital Signs BP (!) 188/92 (BP Location: Left Arm)   Pulse 97   Temp 98.6 F (37 C) (Oral)   Resp 18   Ht 5\' 1"  (1.549 m)   Wt 88.3 kg   SpO2 100%   BMI 36.77 kg/m   Physical Exam Vitals and nursing note reviewed.  Constitutional:      General: She is not in acute distress.    Appearance: She is well-developed. She is not diaphoretic.  HENT:     Head: Normocephalic and atraumatic.  Eyes:     General: No visual field deficit.    Conjunctiva/sclera: Conjunctivae normal.  Cardiovascular:     Rate and Rhythm: Normal rate and regular rhythm.     Pulses: Normal pulses.  Pulmonary:     Effort: Pulmonary effort is normal. No respiratory distress.  Musculoskeletal:        General: No tenderness.     Cervical back: Normal range of motion.     Comments: In r forearm cast  Skin:    General: Skin is warm and dry.     Findings: No erythema or rash.  Neurological:     Mental Status: She is alert and oriented to person, place, and time.     GCS: GCS eye subscore is 4. GCS verbal  subscore is 5. GCS motor subscore is 6.     Cranial Nerves: No cranial nerve deficit, dysarthria or facial asymmetry.     Sensory: Sensation is intact. No sensory deficit.     Motor: Motor function is intact. No pronator drift.     Coordination: Romberg sign positive. Coordination normal. Finger-Nose-Finger Test and Heel to [facility] Test normal.     Gait: Gait abnormal.     Comments: RUE strength and testing limited by recent RUE surgery, able to grip, normal sensation, no pronator drift Bilateral LE equal, normal sensation     ED Results / Procedures / Treatments   Labs (all labs ordered are listed, but only abnormal results are displayed) Labs Reviewed  COMPREHENSIVE METABOLIC PANEL - Abnormal; Notable for the following components:      Result Value   Chloride 95 (*)    Glucose, Bld 139 (*)    All other components within normal limits  URINALYSIS,  ROUTINE W REFLEX MICROSCOPIC - Abnormal; Notable for the following components:   APPearance HAZY (*)    Hgb urine dipstick SMALL (*)    Protein, ur 30 (*)    Nitrite POSITIVE (*)    Leukocytes,Ua TRACE (*)    All other components within normal limits  CBG MONITORING, ED - Abnormal; Notable for the following components:   Glucose-Capillary 128 (*)    All other components within normal limits  I-STAT CHEM 8, ED - Abnormal; Notable for the following components:   Glucose, Bld 134 (*)    Calcium, Ion 1.14 (*)    Hemoglobin 15.3 (*)    All other components within normal limits  SARS CORONAVIRUS 2 (TAT 6-24 HRS)  ETHANOL  PROTIME-INR  APTT  RAPID URINE DRUG SCREEN, HOSP PERFORMED  CBC WITH DIFFERENTIAL/PLATELET  CBC WITH DIFFERENTIAL/PLATELET    EKG EKG Interpretation  Date/Time:  Saturday October 30 2019 13:18:40 EST Ventricular Rate:  106 PR Interval:    QRS Duration: 93 QT Interval:  354 QTC Calculation: 471 R Axis:   58 Text Interpretation: Sinus tachycardia Low voltage, precordial leads No significant change since last  tracing Confirmed by Alvira Monday ([id number]) on 10/30/2019 4:45:05 PM   Radiology CT Angio Head W or Wo Contrast  Result Date: 10/30/2019 CLINICAL DATA:  Right facial numbness EXAM: CT ANGIOGRAPHY HEAD AND NECK TECHNIQUE: Multidetector CT imaging of the head and neck was performed using the standard protocol during bolus administration of intravenous contrast. Multiplanar CT image reconstructions and MIPs were obtained to evaluate the vascular anatomy. Carotid stenosis measurements (when applicable) are obtained utilizing NASCET criteria, using the distal internal carotid diameter as the denominator. CONTRAST:  33mL OMNIPAQUE IOHEXOL 350 MG/ML SOLN COMPARISON:  None. FINDINGS: CT HEAD FINDINGS Brain: There is no acute intracranial hemorrhage, mass effect, or edema. Gray-white differentiation is preserved. Patchy hypoattenuation in the supratentorial white matter is nonspecific but may reflect mild chronic microvascular ischemic changes. Ventricles and sulci are normal in size and configuration. Vascular: No hyperdense vessel. Intracranial atherosclerotic calcification is present at the skull base. Skull: Unremarkable.  Likely chronic nasal bone irregularity. Sinuses: Aerated. Orbits: Unremarkable. Review of the MIP images confirms the above findings CTA NECK FINDINGS Aortic arch: Great vessel origins are patent. Right carotid system: Patent. Minimal plaque is present at the common carotid bifurcation and proximal ICA without measurable stenosis. Left carotid system: Patent. Minimal plaque is present at the bifurcation and proximal ICA without measurable stenosis. Vertebral arteries: Patent and codominant. Skeleton: Multilevel degenerative changes of the cervical spine. Other neck: No neck mass or adenopathy. Upper chest: No apical lung mass Review of the MIP images confirms the above findings CTA HEAD FINDINGS Anterior circulation: Intracranial internal carotid arteries are patent with calcified plaque causing  mild stenosis. Anterior and middle cerebral arteries are patent. Posterior circulation: Intracranial vertebral arteries are patent with mild calcified plaque. Basilar artery is patent. Posterior cerebral arteries are patent. Venous sinuses: As permitted by contrast timing, patent. Review of the MIP images confirms the above findings IMPRESSION: No acute intracranial hemorrhage or evidence of acute infarction. Probable mild chronic microvascular ischemic changes. No large vessel occlusion or hemodynamically significant stenosis. Electronically Signed   By: Guadlupe Spanish M.D.   On: 10/30/2019 16:19   CT Angio Neck W and/or Wo Contrast  Result Date: 10/30/2019 CLINICAL DATA:  Right facial numbness EXAM: CT ANGIOGRAPHY HEAD AND NECK TECHNIQUE: Multidetector CT imaging of the head and neck was performed using the standard protocol during bolus  administration of intravenous contrast. Multiplanar CT image reconstructions and MIPs were obtained to evaluate the vascular anatomy. Carotid stenosis measurements (when applicable) are obtained utilizing NASCET criteria, using the distal internal carotid diameter as the denominator. CONTRAST:  64mL OMNIPAQUE IOHEXOL 350 MG/ML SOLN COMPARISON:  None. FINDINGS: CT HEAD FINDINGS Brain: There is no acute intracranial hemorrhage, mass effect, or edema. Gray-white differentiation is preserved. Patchy hypoattenuation in the supratentorial white matter is nonspecific but may reflect mild chronic microvascular ischemic changes. Ventricles and sulci are normal in size and configuration. Vascular: No hyperdense vessel. Intracranial atherosclerotic calcification is present at the skull base. Skull: Unremarkable.  Likely chronic nasal bone irregularity. Sinuses: Aerated. Orbits: Unremarkable. Review of the MIP images confirms the above findings CTA NECK FINDINGS Aortic arch: Great vessel origins are patent. Right carotid system: Patent. Minimal plaque is present at the common carotid  bifurcation and proximal ICA without measurable stenosis. Left carotid system: Patent. Minimal plaque is present at the bifurcation and proximal ICA without measurable stenosis. Vertebral arteries: Patent and codominant. Skeleton: Multilevel degenerative changes of the cervical spine. Other neck: No neck mass or adenopathy. Upper chest: No apical lung mass Review of the MIP images confirms the above findings CTA HEAD FINDINGS Anterior circulation: Intracranial internal carotid arteries are patent with calcified plaque causing mild stenosis. Anterior and middle cerebral arteries are patent. Posterior circulation: Intracranial vertebral arteries are patent with mild calcified plaque. Basilar artery is patent. Posterior cerebral arteries are patent. Venous sinuses: As permitted by contrast timing, patent. Review of the MIP images confirms the above findings IMPRESSION: No acute intracranial hemorrhage or evidence of acute infarction. Probable mild chronic microvascular ischemic changes. No large vessel occlusion or hemodynamically significant stenosis. Electronically Signed   By: Guadlupe Spanish M.D.   On: 10/30/2019 16:19   MR BRAIN WO CONTRAST  Result Date: 10/30/2019 CLINICAL DATA:  Right facial numbness EXAM: MRI HEAD WITHOUT CONTRAST TECHNIQUE: Multiplanar, multiecho pulse sequences of the brain and surrounding structures were obtained without intravenous contrast. COMPARISON:  Correlation made with same day CTA FINDINGS: Brain: There is no acute infarction or intracranial hemorrhage. There is no intracranial mass, mass effect, or edema. There is no hydrocephalus or extra-axial fluid collection. Patchy foci of T2 hyperintensity in the supratentorial white matter are nonspecific but may reflect mild chronic microvascular ischemic changes. Small chronic left cerebellar infarcts are noted. Vascular: Major vessel flow voids at the skull base are preserved. Skull and upper cervical spine: Normal marrow signal is  preserved. Sinuses/Orbits: Paranasal sinuses are aerated. Orbits are unremarkable. Other: Sella is unremarkable.  Mastoid air cells are clear. IMPRESSION: No acute infarction, intracranial hemorrhage, or mass. Mild chronic microvascular ischemic changes. Small chronic left cerebellar infarcts. Electronically Signed   By: Guadlupe Spanish M.D.   On: 10/30/2019 19:22    Procedures Procedures (including critical care time)  Medications Ordered in ED Medications  diphenhydrAMINE (BENADRYL) injection 25 mg (has no administration in time range)  iohexol (OMNIPAQUE) 350 MG/ML injection 75 mL (75 mLs Intravenous Contrast Given 10/30/19 1540)  acetaminophen (TYLENOL) tablet 1,000 mg (1,000 mg Oral Given 10/30/19 1728)  prochlorperazine (COMPAZINE) injection 10 mg (10 mg Intravenous Given 10/30/19 2137)    ED Course  I have reviewed the triage vital signs and the nursing notes.  Pertinent labs & imaging results that were available during my care of the patient were reviewed by me and considered in my medical decision making (see chart for details).    MDM Rules/Calculators/A&P  69 year old female with a history of hyperlipidemia not on medications due to history of reactions, RA, diet-controlled diabetes, right wrist surgery with Dr. Amanda Pea on Tuesday, who presents with concern for headache, right facial numbness, and difficulty walking.  Concern for CVA by history with headache wihtout other neurologic symptoms/LNW at 730AM prior to awaking at 10AM. She is out of tPA window, and is not VAN positive without significant focal abnormalities on exam although RUE exam is limited by recent surgery.  Will order CT angio, however head and neck emergently for evaluation given symptoms as well as headache.   No sign of hypoglycemia.  Labs show no acute abnormalities. Urine with nitrites but no other significant abnormalities.  CTA shows no acute findings.    Given symptoms, will order MRI for  CVA evaluation.   MRI shows small chronic left cerebellar infarcts.  Patient with continued positive romberg, unable to ambulate in the ED. Discussed with Dr. Amada Jupiter of Neurology. Consider recrudescence of prior CVA from migraine or other. Pt without infectious symptoms.  Given migraine cocktail.  Given inability to ambulate, will admit for further care and treatment with other Neurology recommendations pending at this time.     Final Clinical Impression(s) / ED Diagnoses Final diagnoses:  Numbness and tingling of right face  Dysequilibrium    Rx / DC Orders ED Discharge Orders    None       Alvira Monday, MD 10/30/19 2141

## 2019-10-30 NOTE — H&P (Addendum)
History and Physical    Kristy Gill WNU:[id number] DOB: 10-Sep-1951 DOA: 10/30/2019  PCP: Patient, [name] Per  Patient coming from: Home  Chief Complaint: Ataxia  HPI: Steele Ledonne is a 69 y.o. female with medical history significant of Rheumatoid Arthritis, HLD, T2DM, HTN, Hypothyroidism, and Gout who presented with headache, R. Facial numbness and ataxic gait.  Patient reports she was in USOH thru 4:30 am this morning, at which point she woke up with pain around her R eye, describing a shooting pain down around the eye towards her cheek.  She had an associated headache.  She reports she took some pain medications and then went back to sleep and woke up again around 7:30 and she states at this time the area of pain around her eyes had turned into more of a numbness.  She reports having eaten at this time then went back to bed.  She again woke up around 10:00 am and this time she states when she got out of bed she could no longer ambulate normally.  She states when she stood up to walk she started leaning to her R. Side.  She denies falling but felt that she would keep falling towards her R. Side.  She reports she has no hx of strokes or migraines.  She did have work done by her PT who she reports worked on her neck last Monday but she has not had any persistent neck pain.  She denies any hx of HTN, reports she is not on meds for her RA due to intolerance and her DM is diet-controlled.  She normally ambulates without any DME.  Non-smoker, No alcohol use, No drug use  She had recent R. Wrist surgery   Review of Systems: As per HPI otherwise 10 point review of systems negative.    Past Medical History:  Diagnosis Date  . Arthritis   . Diabetes mellitus without complication (HCC)   . Gout   . Hypertension   . Thyroid disease    hypothryoidism    History reviewed. No pertinent surgical history.   reports that she has never smoked. She has never used smokeless tobacco. She reports that  she does not drink alcohol or use drugs.  Allergies  Allergen Reactions  . Ceclor [Cefaclor] Shortness Of Breath, Itching and Rash  . Metformin And Related Shortness Of Breath, Rash and Other (See Comments)    Fatigue, scalp rash, shakiness  . Voltaren [Diclofenac] Shortness Of Breath and Swelling    Reaction to gel - throat swelling  . Morphine And Related Nausea And Vomiting    Projectile vomiting  . Amlodipine Swelling and Other (See Comments)    Minor shortness of breath, fatigue, headache,   . Arava [Leflunomide] Nausea And Vomiting and Other (See Comments)    Chest pain, tachycardia, elevated bp  . Atorvastatin Other (See Comments)    Severe joint pain and abdominal pain  . Boswellia Hives  . Cephalosporins Other (See Comments)    Unknown reaction  . Clindamycin/Lincomycin Diarrhea    Severe diarrhea  . Gemfibrozil Itching, Nausea And Vomiting and Other (See Comments)    Muscle pain, hot flashes  . Lisinopril Cough  . Lodine [Etodolac] Other (See Comments)    Stomach and abdominal pain, mouth ulcers  . Medrol [Methylprednisolone] Other (See Comments)    Stomach pain/ tachycardia  . Methotrexate Derivatives Other (See Comments)    Fatigue/ GI symptoms/ headache/ muscle pain/ face and scalp rash, mouth ulcers  . Niacin And  Related Other (See Comments)    Flushing, heartburn, insomnia, joint pain, elevated heart rate  . Plaquenil [Hydroxychloroquine] Itching and Other (See Comments)    Blisters on scalp   . Pravastatin Other (See Comments)    Muscle pain  . Rayos [Prednisone] Other (See Comments)    Stomach pain, tachycardia, muscle spasms, parathesia  . Sulfa Antibiotics Other (See Comments)    Unknown reaction  . Unasyn [Ampicillin-Sulbactam Sodium] Itching and Other (See Comments)    Blisters on scalp   . Zocor [Simvastatin] Diarrhea and Other (See Comments)    Abdominal pain,   . Actemra [Tocilizumab] Rash and Other (See Comments)    Facial rash, rash at  injection site, rectal bleeding, headache  . Baclofen Rash    Petechial face rash  . Enbrel [Etanercept] Rash and Other (See Comments)    Redness, swelling at injection site, tightness in throat w/ mild respiratory distress, muscle spasms, bell's palsy, discoid lupus  . Ibuprofen Swelling and Rash    Swollen face  . Other Itching, Rash and Other (See Comments)    monsel solution caused rash, itching  . Penicillins Rash    Did it involve swelling of the face/tongue/throat, SOB, or low BP? No Did it involve sudden or severe rash/hives, skin peeling, or any reaction on the inside of your mouth or nose? Yes Did you need to seek medical attention at a hospital or doctor's office? No When did it last happen?approx 69 years old If all above answers are "NO", may proceed with cephalosporin use.  . Simponi [Golimumab] Hives, Itching and Rash  . Zetia [Ezetimibe] Rash, Other (See Comments) and Cough    Headache, itchy rash, fatigue, dry mouth, dry cough    History reviewed. No pertinent family history.    Physical Exam: Vitals:   10/30/19 1700 10/30/19 1955 10/30/19 2200 10/30/19 2300  BP: (!) 176/85 (!) 188/92 (!) 192/86 (!) 141/73  Pulse: (!) 101 97 95 96  Resp: 14 18  13   Temp:    97.9 F (36.6 C)  TempSrc:    Oral  SpO2: 95% 100% 91% 97%  Weight:      Height:        Vitals:   10/30/19 1700 10/30/19 1955 10/30/19 2200 10/30/19 2300  BP: (!) 176/85 (!) 188/92 (!) 192/86 (!) 141/73  Pulse: (!) 101 97 95 96  Resp: 14 18  13   Temp:    97.9 F (36.6 C)  TempSrc:    Oral  SpO2: 95% 100% 91% 97%  Weight:      Height:        Constitutional: NAD, calm, comfortable Eyes: PERRL, lids and conjunctivae normal ENMT: Mucous membranes are moist. Posterior pharynx clear of any exudate or lesions.Normal dentition.  Neck: normal, supple, no masses, no thyromegaly Respiratory: clear to auscultation bilaterally, no wheezing, no crackles. Normal respiratory effort. No accessory muscle  use.  Cardiovascular: Regular rate and rhythm, no murmurs / rubs / gallops. No extremity edema. 2+ pedal pulses. No carotid bruits.  Abdomen: no tenderness, no masses palpated. No hepatosplenomegaly. Bowel sounds positive.  Musculoskeletal: no clubbing / cyanosis. No joint deformity upper and lower extremities. Good ROM, no contractures. Normal muscle tone.  Skin: no rashes, lesions, ulcers. No induration Neurologic: CN 2-12 grossly intact. Sensation intact, DTR normal. Strength 5/5 in evaluated extremities, RUE limited evaluation due to recent surgery Psychiatric: Normal judgment and insight. Alert and oriented x 3. Normal mood.    Labs on Admission: I have personally  Marland Kitchen an Reil TTAG>TEXTTAG>>ian Reil uzzy HanCapital [phone] [address]KemmererKorea71mMarland Kitchen hade FloodJerilee FieldMarland KitchenJulian Reil Buzzy HanCapital [phone] [address]KermitKorea15mMarland Kitchen Shade FloodJerilee FieldMarland KitchenJulian Reil Buzzy HanCapital [phone] [address]Roosevelt GardensKorea80mMarland Kitchen  Upper chest: No apical lung mass Review of the MIP images confirms the above findings CTA HEAD FINDINGS Anterior circulation: Intracranial internal carotid arteries are patent with calcified plaque causing mild stenosis. Anterior and middle cerebral arteries are patent. Posterior circulation: Intracranial vertebral arteries are patent with mild calcified plaque. Basilar artery is patent. Posterior cerebral arteries are patent. Venous sinuses: As permitted by contrast timing, patent. Review of the MIP images confirms the above findings IMPRESSION: No acute intracranial hemorrhage or evidence of acute infarction. Probable mild chronic microvascular ischemic changes. No large vessel occlusion or hemodynamically significant stenosis. Electronically Signed   By: Guadlupe Spanish M.D.   On: 10/30/2019 16:19   CT Angio Neck W and/or Wo Contrast  Result Date: 10/30/2019 CLINICAL DATA:  Right facial numbness EXAM: CT ANGIOGRAPHY HEAD AND NECK TECHNIQUE: Multidetector CT imaging of the head and neck was performed using the standard protocol during bolus administration of intravenous contrast. Multiplanar CT image reconstructions and MIPs were obtained to evaluate the vascular anatomy. Carotid stenosis measurements (when applicable) are obtained utilizing NASCET criteria, using the distal internal carotid diameter as the denominator. CONTRAST:  56mL OMNIPAQUE IOHEXOL 350 MG/ML SOLN COMPARISON:  None. FINDINGS: CT HEAD FINDINGS Brain: There is no acute intracranial hemorrhage, mass effect, or edema. Gray-white  differentiation is preserved. Patchy hypoattenuation in the supratentorial white matter is nonspecific but may reflect mild chronic microvascular ischemic changes. Ventricles and sulci are normal in size and configuration. Vascular: No hyperdense vessel. Intracranial atherosclerotic calcification is present at the skull base. Skull: Unremarkable.  Likely chronic nasal bone irregularity. Sinuses: Aerated. Orbits: Unremarkable. Review of the MIP images confirms the above findings CTA NECK FINDINGS Aortic arch: Great vessel origins are patent. Right carotid system: Patent. Minimal plaque is present at the common carotid bifurcation and proximal ICA without measurable stenosis. Left carotid system: Patent. Minimal plaque is present at the bifurcation and proximal ICA without measurable stenosis. Vertebral arteries: Patent and codominant. Skeleton: Multilevel degenerative changes of the cervical spine. Other neck: No neck mass or adenopathy. Upper chest: No apical lung mass Review of the MIP images confirms the above findings CTA HEAD FINDINGS Anterior circulation: Intracranial internal carotid arteries are patent with calcified plaque causing mild stenosis. Anterior and middle cerebral arteries are patent. Posterior circulation: Intracranial vertebral arteries are patent with mild calcified plaque. Basilar artery is patent. Posterior cerebral arteries are patent. Venous sinuses: As permitted by contrast timing, patent. Review of the MIP images confirms the above findings IMPRESSION: No acute intracranial hemorrhage or evidence of acute infarction. Probable mild chronic microvascular ischemic changes. No large vessel occlusion or hemodynamically significant stenosis. Electronically Signed   By: Guadlupe Spanish M.D.   On: 10/30/2019 16:19   MR BRAIN WO CONTRAST  Result Date: 10/30/2019 CLINICAL DATA:  Right facial numbness EXAM: MRI HEAD WITHOUT CONTRAST TECHNIQUE: Multiplanar, multiecho pulse sequences of the brain and  surrounding structures were obtained without intravenous contrast. COMPARISON:  Correlation made with same day CTA FINDINGS: Brain: There is no acute infarction or intracranial hemorrhage. There is no intracranial mass, mass effect, or edema. There is no hydrocephalus or extra-axial fluid collection. Patchy foci of T2 hyperintensity in the supratentorial white matter are nonspecific but may reflect mild chronic microvascular ischemic changes. Small chronic left cerebellar infarcts are noted. Vascular: Major vessel flow voids at the skull base are preserved. Skull and upper cervical spine: Normal marrow signal is preserved. Sinuses/Orbits: Paranasal sinuses are aerated. Orbits are unremarkable. Other: Sella is unremarkable.  Mastoid air cells  are clear. IMPRESSION: No acute infarction, intracranial hemorrhage, or mass. Mild chronic microvascular ischemic changes. Small chronic left cerebellar infarcts. Electronically Signed   By: Macy Mis M.D.   On: 10/30/2019 19:22    EKG: Independently reviewed.   Assessment/Plan Ossie Yebra is a 69 y.o. female with medical history significant of Rheumatoid Arthritis, T2DM, HTN, Hypothyroidism, and Gout who presented with headache, R. Facial numbness and ataxic gait.  # Gait Ataxia/Headache/Facial pain - no hx of Stroke/TIA, complex migraines, trigeminal neuralgia - no hx of similar symptoms despite MRI revealing prior small chronic L. Cerebellar infarcts but no acute findings.  No vertiginous symptoms but reporting nausea. - CTA head and neck without any occlusion, dissection or acute findings - Appreciate consultation from Dr. Leonel Ramsay, has ordered further eval of neck with MRI of cervical and thoracic spine - would also consider T and L spine given ataxia for peripheral etiology but will defer evaluation to Neurology at this time  # Gout - per patient, rheumatology has planned for 3 months of colchicine - continue allopurinol  # Hypothyroidism -  continue levothyroxine  # HTN # T2DM - has not been on meds  DVT prophylaxis: Lovenox Code Status: Full Consults called: Neurology, Dr. Leonel Ramsay Admission status: observation   Truddie Hidden MD [facility] Pager [phone]  If 7PM-7AM, please contact night-coverage [email or web address] Password [facility]  10/30/2019, 11:25 PM

## 2019-10-30 NOTE — ED Notes (Addendum)
Pt in wheelchair.  Pt is 1 wk post-op from RUE ortho surgery.  States woke up during night with severe right facial pain near bridge of nose; took pain med and went to bed.  Over course of past 2-3 hours now has right facial numbness, "the right side of my body doesn't feel right", and c/o inability to stand straight.  Pt alert and oriented and conversing without any difficulty. Discussed with Laurier Nancy, PA - instructed pt and family to go to ED immediately; directed where to take pt.  Pt & family verbalized understanding.  Pt assisted into vehicle.

## 2019-10-30 NOTE — ED Triage Notes (Addendum)
Patients arrives via POV due having new onset weakness on her right side. Per the patient, around 1000 she went to the restroom and had difficulty getting up from the toilet, where she developed increased weakness to the right side, numbness to right side of face (resolved on arrival)  She recently had joint repair surgery on this past Tuesday where she had a reaction to the nerve block.

## 2019-10-30 NOTE — ED Notes (Signed)
Pt to MRI

## 2019-10-30 NOTE — Consult Note (Signed)
Neurology Consultation Reason for Consult: Inability to walk Referring Physician: Dalene Seltzer, E  CC: Difficulty walking  History is obtained from: Patient  HPI: Kristy Gill is a 69 y.o. female with a history of hypertension, diabetes, no known history of stroke who presents with difficulty with walking that started on awakening this morning.  She states that she just keeps going to the right.  She also describes a retro-orbital headache on the right which is unilateral, with associated photophobia.  She has a history of neck problems and has had some difficulty with vertigo in the past, which she states is treated by her chiropracter with neck manipulations.   She does not currently have vertigo.   LKW: last night tpa given?: no, out of window    ROS: A 14 point ROS was performed and is negative except as noted in the HPI.   Past Medical History:  Diagnosis Date  . Arthritis   . Diabetes mellitus without complication (HCC)   . Gout   . Hypertension   . Thyroid disease    hypothryoidism     History reviewed. No pertinent family history.   Social History:  reports that she has never smoked. She has never used smokeless tobacco. She reports that she does not drink alcohol or use drugs.   Exam: Current vital signs: BP (!) 192/86   Pulse 95   Temp 98.6 F (37 C) (Oral)   Resp 18   Ht 5\' 1"  (1.549 m)   Wt 88.3 kg   SpO2 91%   BMI 36.77 kg/m  Vital signs in last 24 hours: Temp:  [98.6 F (37 C)] 98.6 F (37 C) (01/09 1311) Pulse Rate:  [95-101] 95 (01/09 2200) Resp:  [14-20] 18 (01/09 1955) BP: (176-192)/(85-99) 192/86 (01/09 2200) SpO2:  [91 %-100 %] 91 % (01/09 2200) Weight:  [88.3 kg] 88.3 kg (01/09 1309)   Physical Exam  Constitutional: Appears well-developed and well-nourished.  Psych: Affect appropriate to situation Eyes: No scleral injection HENT: No OP obstrucion MSK: no joint deformities.  Cardiovascular: Normal rate and regular rhythm.   Respiratory: Effort normal, non-labored breathing GI: Soft.  No distension. There is no tenderness.  Skin: WDI  Neuro: Mental Status: Patient is awake, alert, oriented to person, place, month, year, and situation. Patient is able to give a clear and coherent history. No signs of aphasia or neglect Cranial Nerves: II: Visual Fields are full. Pupils are equal, round, and reactive to light.   III,IV, VI: EOMI without ptosis or diploplia.  V: Facial sensation is symmetric to temperature VII: Facial movement is symmetric.  VIII: hearing is intact to voice X: Uvula elevates symmetrically XI: Shoulder shrug is symmetric. XII: tongue is midline without atrophy or fasciculations.  Motor: Tone is normal. Bulk is normal. 5/5 strength was present in all four extremities.  Sensory: Sensation is symmetric to light touch and temperature in the arms and legs. Deep Tendon Reflexes: 3+ and symmetric in the biceps and patellae. Absent at ankles Plantars: Toes are downgoing bilaterally.  Cerebellar: No clear ataxia, though slightly limited in the RUE due to cast and RLE due to  Reduced knee mobility     I have reviewed labs in epic and the results pertinent to this consultation are: cmp - unremarkable  I have reviewed the images obtained: MRI brain - negative, CTA - negative  Impression: 69 yo F with gait disorder and unilateral retro-orbital headache.  I think one possibility would be that this represents complicated migraine  versus recrudescence of her right cerebellar stroke in the setting of migraine.  Given her history of spinal disease, as well as recent neck manipulations, I would favor ruling out spinal pathology.  Recommendations: 1) MRI C-spine, T-spine 2) Compazine/Benadryl 3) PT, OT   Roland Rack, MD [facility] [phone]  If 7pm- 7am, please page neurology on call as listed in [place].

## 2019-10-31 ENCOUNTER — Encounter (HOSPITAL_COMMUNITY): Payer: Self-pay | Admitting: Internal Medicine

## 2019-10-31 ENCOUNTER — Observation Stay (HOSPITAL_COMMUNITY): Payer: Medicare PPO

## 2019-10-31 ENCOUNTER — Observation Stay (HOSPITAL_BASED_OUTPATIENT_CLINIC_OR_DEPARTMENT_OTHER): Payer: Medicare PPO

## 2019-10-31 DIAGNOSIS — R200 Anesthesia of skin: Secondary | ICD-10-CM | POA: Diagnosis not present

## 2019-10-31 DIAGNOSIS — E1159 Type 2 diabetes mellitus with other circulatory complications: Secondary | ICD-10-CM | POA: Diagnosis not present

## 2019-10-31 DIAGNOSIS — G459 Transient cerebral ischemic attack, unspecified: Secondary | ICD-10-CM

## 2019-10-31 DIAGNOSIS — I10 Essential (primary) hypertension: Secondary | ICD-10-CM

## 2019-10-31 DIAGNOSIS — Z79890 Hormone replacement therapy (postmenopausal): Secondary | ICD-10-CM | POA: Diagnosis not present

## 2019-10-31 DIAGNOSIS — M1990 Unspecified osteoarthritis, unspecified site: Secondary | ICD-10-CM | POA: Diagnosis present

## 2019-10-31 DIAGNOSIS — R519 Headache, unspecified: Secondary | ICD-10-CM | POA: Diagnosis not present

## 2019-10-31 DIAGNOSIS — G43909 Migraine, unspecified, not intractable, without status migrainosus: Secondary | ICD-10-CM | POA: Diagnosis present

## 2019-10-31 DIAGNOSIS — M069 Rheumatoid arthritis, unspecified: Secondary | ICD-10-CM | POA: Diagnosis present

## 2019-10-31 DIAGNOSIS — R270 Ataxia, unspecified: Secondary | ICD-10-CM | POA: Diagnosis not present

## 2019-10-31 DIAGNOSIS — E785 Hyperlipidemia, unspecified: Secondary | ICD-10-CM | POA: Diagnosis present

## 2019-10-31 DIAGNOSIS — Z886 Allergy status to analgesic agent status: Secondary | ICD-10-CM | POA: Diagnosis not present

## 2019-10-31 DIAGNOSIS — Z885 Allergy status to narcotic agent status: Secondary | ICD-10-CM | POA: Diagnosis not present

## 2019-10-31 DIAGNOSIS — Z881 Allergy status to other antibiotic agents status: Secondary | ICD-10-CM | POA: Diagnosis not present

## 2019-10-31 DIAGNOSIS — Z880 Allergy status to penicillin: Secondary | ICD-10-CM | POA: Diagnosis not present

## 2019-10-31 DIAGNOSIS — E039 Hypothyroidism, unspecified: Secondary | ICD-10-CM | POA: Diagnosis present

## 2019-10-31 DIAGNOSIS — E119 Type 2 diabetes mellitus without complications: Secondary | ICD-10-CM | POA: Diagnosis present

## 2019-10-31 DIAGNOSIS — Z888 Allergy status to other drugs, medicaments and biological substances status: Secondary | ICD-10-CM | POA: Diagnosis not present

## 2019-10-31 DIAGNOSIS — M47812 Spondylosis without myelopathy or radiculopathy, cervical region: Secondary | ICD-10-CM | POA: Diagnosis present

## 2019-10-31 DIAGNOSIS — M109 Gout, unspecified: Secondary | ICD-10-CM

## 2019-10-31 DIAGNOSIS — Z882 Allergy status to sulfonamides status: Secondary | ICD-10-CM | POA: Diagnosis not present

## 2019-10-31 DIAGNOSIS — R42 Dizziness and giddiness: Secondary | ICD-10-CM | POA: Diagnosis not present

## 2019-10-31 DIAGNOSIS — Z20822 Contact with and (suspected) exposure to covid-19: Secondary | ICD-10-CM | POA: Diagnosis present

## 2019-10-31 LAB — HEMOGLOBIN A1C
Hgb A1c MFr Bld: 6.7 % — ABNORMAL HIGH (ref 4.8–5.6)
Mean Plasma Glucose: 145.59 mg/dL

## 2019-10-31 LAB — LIPID PANEL
Cholesterol: 239 mg/dL — ABNORMAL HIGH (ref 0–200)
HDL: 42 mg/dL (ref >40)
LDL Cholesterol: 157 mg/dL — ABNORMAL HIGH (ref 0–99)
Total CHOL/HDL Ratio: 5.7 RATIO
Triglycerides: 199 mg/dL — ABNORMAL HIGH (ref <150)
VLDL: 40 mg/dL (ref 0–40)

## 2019-10-31 LAB — ECHOCARDIOGRAM COMPLETE
Height: 61 in
Weight: 2962.98 oz

## 2019-10-31 LAB — HIV ANTIBODY (ROUTINE TESTING W REFLEX): HIV Screen 4th Generation wRfx: NONREACTIVE

## 2019-10-31 LAB — SARS CORONAVIRUS 2 (TAT 6-24 HRS): SARS Coronavirus 2: NEGATIVE

## 2019-10-31 MED ORDER — VALPROATE SODIUM 500 MG/5ML IV SOLN
1000.0000 mg | Freq: Once | INTRAVENOUS | Status: DC
  Filled 2019-10-31: qty 10

## 2019-10-31 MED ORDER — METOCLOPRAMIDE HCL 5 MG/ML IJ SOLN: 10.0000 mg | Freq: Once | INTRAMUSCULAR | Status: DC

## 2019-10-31 MED ORDER — MAGNESIUM SULFATE 2 GM/50ML IV SOLN
2.0000 g | Freq: Once | INTRAVENOUS | Status: AC
  Administered 2019-10-31: 2 g via INTRAVENOUS
  Filled 2019-10-31: qty 50

## 2019-10-31 MED ORDER — HYDRALAZINE HCL 20 MG/ML IJ SOLN
5.0000 mg | Freq: Four times a day (QID) | INTRAMUSCULAR | Status: DC | PRN
  Filled 2019-10-31: qty 1

## 2019-10-31 NOTE — Progress Notes (Signed)
  Echocardiogram 2D Echocardiogram has been performed.  Kristy Gill 10/31/2019, 9:11 AM

## 2019-10-31 NOTE — Evaluation (Signed)
Physical Therapy Evaluation Patient Details Name: Kristy Gill MRN: [id number] DOB: 1950-11-01 Today's Date: 10/31/2019   History of Present Illness  This 69 y.o. female with medical history significant of Rheumatoid Arthritis, HLD, T2DM, HTN, Hypothyroidism, and Gout who presented with headache, R. Facial numbness and ataxic gait.  Pt with recent wrist surgery and Rt UE casted. MRI of brain showed no acute infarct, but small chronic cerebellar infarcts.   MRI of cervical spine showed cervical spine spondylosis most noteable at C5-C6 with Rt paracentral disc potrusion contacting the exiting Rt C6 nerve Rt with severe bil. neural foraminal narrowing and moderate central canal stenosis.  Also at C6-C7 showed severe bilateral neural foraminal narrowing and moderate central canal stenosis.  Neuro surgery consulted.     Clinical Impression  Pt presents with moderately significant limitations to functional mobility due to impaired motor control and likely vestibular dysfunction, complicated by cardiovascular problems, resulting in need for at least moderate assistance to change positions and move around.  Observed ataxic RLE impacting gait and requiring 2 person assist for safety.  Screened vestibular function, eliciting symptoms with head turns and position changes.  Pt has long history of risk factors for vestibular problems including history of ear infections and history of vertigo/cervicogenic dizziness.  Suspect peripheral system dysfunction in R ear.  Pt will benefit from intensive inpatient rehab in postacute setting to address deficits with goal of restoring independent mobility (screening consult for CIR placed).  PT will initiate care in acute setting and assist with d/c planning as appropriate.    Follow Up Recommendations CIR    Equipment Recommendations  None recommended by PT    Recommendations for Other Services Rehab consult     Precautions / Restrictions Precautions Precautions:  Fall Precaution Comments: up with assist only; s/p R wrist surgery w/cast in place Required Braces or Orthoses: Splint/Cast Splint/Cast: Rt wrist  Restrictions Weight Bearing Restrictions: No      Mobility  Bed Mobility Overal bed mobility: Needs Assistance Bed Mobility: Supine to Sit;Sit to Sidelying     Supine to sit: Supervision   Sit to sidelying: Min assist General bed mobility comments: limited due to casted R wrist and needs slightly incr time; sidelying test for vestibular dysfunction revealed right sidelying to sit is limited due to vestib symptoms  Transfers Overall transfer level: Needs assistance Equipment used: 2 person hand held assist Transfers: Sit to/from Stand Sit to Stand: Mod assist;+2 safety/equipment         General transfer comment: demonstrates difficulty load bearing on RLE during transition to standing due to unreliable motor control impairments, and requires assist on standing to obtain and maintain balance  Ambulation/Gait Ambulation/Gait assistance: +2 physical assistance;Mod assist Gait Distance (Feet): 20 Feet Assistive device: 2 person hand held assist Gait Pattern/deviations: Step-through pattern;Ataxic     General Gait Details: demonstrates anticipatory preparation for step-off due to unreliable RLE.  Minimal buckling or loss of extension in stance, but poor balance control with weight-shift to right, noting lack of control with foot placement, lean to right onto therapist, and cross-over stepping after 180* turn.    Stairs            Wheelchair Mobility    Modified Rankin (Stroke Patients Only) Modified Rankin (Stroke Patients Only) Pre-Morbid Rankin Score: No symptoms Modified Rankin: Moderately severe disability     Balance Overall balance assessment: Needs assistance Sitting-balance support: Feet supported;No upper extremity supported Sitting balance-Leahy Scale: Good     Standing balance support: Bilateral  upper  extremity supported;During functional activity Standing balance-Leahy Scale: Poor Standing balance comment: dependent on external support to maintain static stance                             Pertinent Vitals/Pain      Home Living Family/patient expects to be discharged to:: Private residence Living Arrangements: Children Available Help at Discharge: Family;Available PRN/intermittently Type of Home: House Home Access: Ramped entrance     Home Layout: Multi-level;Laundry or work area in basement;Full bath on main level;Able to live on main level with bedroom/bathroom   Additional Comments: 36yo son w/learning disabilities lives with pt, works days    Prior Function Level of Independence: Independent         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 Dominance   Dominant Hand: Left    Extremity/Trunk Assessment   Upper Extremity Assessment Upper Extremity Assessment: Defer to OT evaluation;RUE deficits/detail(bil 'severe' arthritis) RUE Deficits / Details: s/p wrist surgery w/cast in place    Lower Extremity Assessment Lower Extremity Assessment: RLE deficits/detail RLE Deficits / Details: intact strength in isolation but functionally ataxic impacting gait RLE Sensation: WNL RLE Coordination: decreased gross motor(observed during gait)    Cervical / Trunk Assessment Cervical / Trunk Assessment: Normal  Communication   Communication: No difficulties  Cognition Arousal/Alertness: Awake/alert Behavior During Therapy: WFL for tasks assessed/performed Overall Cognitive Status: Within Functional Limits for tasks assessed                                        General Comments General comments (skin integrity, edema, etc.): BP elevated 175/103, below SPB limit of 180 so proceeded with exam; Screened vestibular function: pt reports history of ear infections, possible cervicogenic dizziness (treated by local PT), and endorses fullness in R sinuses and  behind eye.  Elicited mild dizziness with fixed gaze head turns, observing habitual avoidance of right rotation. Elicted moderate dizziness w/ right sidelying to sitting, unrated but appears significant.  VOR negative, smooth pursuits and saccades normal, but noted avoidance behavior with VOR testing. Instructed on x1 viewing at .5-1 Hz for 2-3 sets of 5-10 reps; instructed to maintain dizziness <5-6/10 by slowing down or performing less reps; follow up next date for impact.    Exercises     Assessment/Plan    PT Assessment Patient needs continued PT services  PT Problem List Cardiopulmonary status limiting activity;Decreased coordination;Decreased mobility;Decreased balance(vestibular dysfunction, motor control deficit)       PT Treatment Interventions Neuromuscular re-education;Patient/family education;Balance training;Therapeutic exercise;Therapeutic activities;Functional mobility training;Gait training;DME instruction(vestibular rehab)    PT Goals (Current goals can be found in the Care Plan section)  Acute Rehab PT Goals Patient Stated Goal: back to independent PT Goal Formulation: With patient Time For Goal Achievement: 11/14/19 Potential to Achieve Goals: Good    Frequency Min 4X/week   Barriers to discharge Decreased caregiver support caregiver for adult son    Co-evaluation PT/OT/SLP Co-Evaluation/Treatment: Yes Reason for Co-Treatment: To address functional/ADL transfers PT goals addressed during session: Mobility/safety with mobility;Balance         AM-PAC PT "6 Clicks" Mobility  Outcome Measure Help needed turning from your back to your side while in a flat bed without using bedrails?: None Help needed moving from lying on your back to sitting on the side of a flat bed without using  bedrails?: A Little Help needed moving to and from a bed to a chair (including a wheelchair)?: A Lot Help needed standing up from a chair using your arms (e.g., wheelchair or bedside  chair)?: A Lot Help needed to walk in hospital room?: A Lot Help needed climbing 3-5 steps with a railing? : A Lot 6 Click Score: 15    End of Session   Activity Tolerance: Patient tolerated treatment well(had to urinate urgently by end) Patient left: in chair;with call bell/phone within reach Nurse Communication: Mobility status PT Visit Diagnosis: Dizziness and giddiness (R42);Other symptoms and signs involving the nervous system (R29.898);Ataxic gait (R26.0)    Time: 4830-7354 PT Time Calculation (min) (ACUTE ONLY): 38 min   Charges:   PT Evaluation $PT Eval Moderate Complexity: 1 Mod PT Treatments $Neuromuscular Re-education: 8-22 mins        Narda Amber, PT, DPT, MS Board Certified Geriatric Clinical Specialist   Dennis Bast 10/31/2019, 2:09 PM

## 2019-10-31 NOTE — ED Notes (Signed)
Assisted pt to bedside commode, requires 1 assist. Pt is unsteady, notable right sided weakness.

## 2019-10-31 NOTE — ED Notes (Signed)
Patient transported to MRI 

## 2019-10-31 NOTE — Progress Notes (Addendum)
[facility]  PROGRESS NOTE  Kristy Gill ION:[id number] DOB: 09/15/1951 DOA: 10/30/2019 PCP: Patient, [name] Per Admit date - 10/30/2019   Admitting Physician Clydia Llano, MD  Outpatient Primary MD for the patient is Patient, [name] Per  LOS - 0 Brief Narrative   Kristy Gill is a 69 y.o. year old female with medical history significant for migraines, type 2 diabetes, RA, HTN, hypothyroidism who presented on 10/30/2019 with acute onset right-sided weakness associated headache, facial numbness, and gait disorder with right-sided drift admitted for work-up of stroke and concern for potential spinal pathology.  Patient underwent recent orthopedic surgery (10/25/2018) of her right hand related to complications of her chronic RA; during the procedure she had a reaction to the nerve block which she reports this dyspnea and throat swelling while also receiving vancomycin infusion (reports history of hives to vancomycin in the past).  Patient states since being home it took a while to get back to her normal functioning which she thought was related to the sedation wearing off slowly.  She states day prior to admission in the morning time she noted pain in the center of her face with radiation near her right eye and associated headache.  She again went to sleep and upon waking up a few hours later around 10 AM she noted an inability to prevent herself from drifting to the right while sitting and standing and had to grab the wall to make it safely during ambulation at home.  Of note she was seen by physical therapy and reports recent manipulation of her neck on 1/4.    Hospital course Due to presenting symptoms patient underwent stroke work-up as recommended by neurology.  MRI brain was unremarkable.  However given history of cervical neck degenerative disease neurology recommended MRI thoracic/cervical spine.  Cervical spine imaging shows disc protrusion contacting C6 nerve root and elements of  moderate foraminal stenosis.  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  Discussed with neurosurgery who will assess to determine conservative management versus any potential need for neurosurgical intervention -Neurosurgery consulted, appreciate their recommendations  Headache, facial numbness, rightward drift/ataxia Headache resolved, facial numbness resolved, right upper drift still present and in working with PT still significant ataxia and unbalance.  Unclear etiology, neurology believes is unrelated to cervical canal stenosis in the absence of actual weakness -Trial of valproic acid, magnesium and Reglan per neurology -We will need outpatient neurology follow-up, with repeat MRI 4 weeks with thin cuts over the brainstem per neurology recommendations -Patient intolerant of aspirin, Zetia, statins -MRI brain negative for acute stroke -CT head/neck and vascular ultrasound imaging unremarkable  Hypertension, no home meds Could be in the setting of acute symptoms due to above -IV hydralazine as needed (SBP greater than 170, DBP greater than 100)   Chronic RA, stable Status post right hand surgery, 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 -Continue pain control tramadol, Flexeril, tyelenol  Stable Chronic medical problems Gout-allopurinol, 3 months of colchicine per rheumatology Hypothyroidism- synthroid           Family Communication  : No family at bedside  Code Status : Full code  Disposition Plan  :  Change patient status from observation to inpatient as patient is at significant risk for falls and injury  related to imbalance from acute onset gait ataxia with rightward drift, patient requiring moderate assist for functional mobility, prior to this admission she lived with her son who works during the day had limited caregiver support, PT recommending postacute rehab at discharge.  Assessed by PT OT who both recommended CIR, if not labs recommends SNF due to risk mentioned.  Consults  : Neurosurgery, neurology  Procedures  : TTE, 1/10  DVT Prophylaxis  :  Lab Results  Component Value Date   PLT 235 10/30/2019    Diet :  Diet Order            Diet regular Room service appropriate? Yes; Fluid consistency: Thin  Diet effective now               Inpatient Medications Scheduled Meds: .  stroke: mapping our early stages of recovery book   Does not apply Once  . allopurinol  100 mg Oral Q supper  . cholecalciferol  5,000 Units Oral QHS  . colchicine  0.6 mg Oral Daily  . enoxaparin (LOVENOX) injection  40 mg Subcutaneous Q24H  . levothyroxine  112 mcg Oral QAC breakfast   Continuous Infusions: PRN Meds:.acetaminophen **OR** acetaminophen (TYLENOL) oral liquid 160 mg/5 mL **OR** acetaminophen, menthol-cetylpyridinium, Muscle Rub, senna-docusate  Antibiotics  :   Anti-infectives (From admission, onward)   None       Objective   Vitals:   10/31/19 0100 10/31/19 0421 10/31/19 0600 10/31/19 0610  BP: (!) 165/81 (!) 183/90  (!) 183/97  Pulse: 82 91    Resp: 15 14  16   Temp:  98.3 F (36.8 C)  98.1 F (36.7 C)  TempSrc:  Oral  Oral  SpO2: 95% 96%  99%  Weight:   84 kg   Height:   5\' 1"  (1.549 m)     SpO2: 99 %  Wt Readings from Last 3 Encounters:  10/31/19 84 kg    No intake or output data in the 24 hours ending 10/31/19 1137  Physical Exam:  Awake Alert, Oriented X 3, Normal affect Lying in bed Has intact strength in bilateral lower extremities Has intact strength in left upper extremity For the most part has intact strength of right upper extremity, but  limited due to recent orthopedic surgery Cast in place on right arm No facial droop, no dysphagia, normal speech [place].AT, Symmetrical Chest wall movement, Good air movement bilaterally, CTAB RRR,No Gallops,Rubs or new Murmurs,  +ve B.Sounds, Abd Soft, No tenderness, No rebound, guarding or rigidity. No edema   I have personally reviewed the following:   Data Reviewed:  CBC Recent Labs  Lab 10/30/19 1415 10/30/19 1421  WBC 9.1  --   HGB 14.6 15.3*  HCT 44.8 45.0  PLT 235  --   MCV 88.2  --   MCH 28.7  --   MCHC 32.6  --   RDW 12.8  --   LYMPHSABS 1.5  --   MONOABS 0.6  --   EOSABS 0.2  --   BASOSABS 0.1  --     Chemistries  Recent Labs  Lab 10/30/19 1415 10/30/19 1421  NA 135 136  K 3.7 3.9  CL 95* 98  CO2 27  --   GLUCOSE 139* 134*  BUN 12 14  CREATININE 0.72 0.70  CALCIUM 9.7  --   AST 21  --   ALT 26  --  ALKPHOS 75  --   BILITOT 0.8  --    ------------------------------------------------------------------------------------------------------------------ Recent Labs    10/31/19 0430  CHOL 239*  HDL 42  LDLCALC 157*  TRIG 199*  CHOLHDL 5.7    Lab Results  Component Value Date   HGBA1C 6.7 (H) 10/31/2019   ------------------------------------------------------------------------------------------------------------------ No results for input(s): TSH, T4TOTAL, T3FREE, THYROIDAB in the last 72 hours.  Invalid input(s): FREET3 ------------------------------------------------------------------------------------------------------------------ No results for input(s): VITAMINB12, FOLATE, FERRITIN, TIBC, IRON, RETICCTPCT in the last 72 hours.  Coagulation profile Recent Labs  Lab 10/30/19 1415  INR 1.0    No results for input(s): DDIMER in the last 72 hours.  Cardiac Enzymes No results for input(s): CKMB, TROPONINI, MYOGLOBIN in the last 168 hours.  Invalid input(s):  CK ------------------------------------------------------------------------------------------------------------------ No results found for: BNP  Micro Results Recent Results (from the past 240 hour(s))  SARS CORONAVIRUS 2 (TAT 6-24 HRS) Nasopharyngeal Nasopharyngeal Swab     Status: None   Collection Time: 10/30/19  9:39 PM   Specimen: Nasopharyngeal Swab  Result Value Ref Range Status   SARS Coronavirus 2 NEGATIVE NEGATIVE Final    Comment: (NOTE) SARS-CoV-2 target nucleic acids are NOT DETECTED. The SARS-CoV-2 RNA is generally detectable in upper and lower respiratory specimens during the acute phase of infection. Negative results do not preclude SARS-CoV-2 infection, do not rule out co-infections with other pathogens, and should not be used as the sole basis for treatment or other patient management decisions. Negative results must be combined with clinical observations, patient history, and epidemiological information. The expected result is Negative. Fact Sheet for Patients: HairSlick.no Fact Sheet for Healthcare Providers: quierodirigir.com This test is not yet approved or cleared by the [place] FDA and  has been authorized for detection and/or diagnosis of SARS-CoV-2 by FDA under an Emergency Use Authorization (EUA). This EUA will remain  in effect (meaning this test can be used) for the duration of the COVID-19 declaration under Section 56 4(b)(1) of the Act, 21 U.S.C. section 360bbb-3(b)(1), unless the authorization is terminated or revoked sooner. Performed at [facility], [address] [place], [place] [address]     Radiology Reports CT Angio Head W or Wo Contrast  Result Date: 10/30/2019 CLINICAL DATA:  Right facial numbness EXAM: CT ANGIOGRAPHY HEAD AND NECK TECHNIQUE: Multidetector CT imaging of the head and neck was performed using the standard protocol during bolus administration of intravenous  contrast. Multiplanar CT image reconstructions and MIPs were obtained to evaluate the vascular anatomy. Carotid stenosis measurements (when applicable) are obtained utilizing NASCET criteria, using the distal internal carotid diameter as the denominator. CONTRAST:  75mL OMNIPAQUE IOHEXOL 350 MG/ML SOLN COMPARISON:  None. FINDINGS: CT HEAD FINDINGS Brain: There is no acute intracranial hemorrhage, mass effect, or edema. Gray-white differentiation is preserved. Patchy hypoattenuation in the supratentorial white matter is nonspecific but may reflect mild chronic microvascular ischemic changes. Ventricles and sulci are normal in size and configuration. Vascular: No hyperdense vessel. Intracranial atherosclerotic calcification is present at the skull base. Skull: Unremarkable.  Likely chronic nasal bone irregularity. Sinuses: Aerated. Orbits: Unremarkable. Review of the MIP images confirms the above findings CTA NECK FINDINGS Aortic arch: Great vessel origins are patent. Right carotid system: Patent. Minimal plaque is present at the common carotid bifurcation and proximal ICA without measurable stenosis. Left carotid system: Patent. Minimal plaque is present at the bifurcation and proximal ICA without measurable stenosis. Vertebral arteries: Patent and codominant. Skeleton: Multilevel degenerative changes of the cervical spine. Other neck: No  neck mass or adenopathy. Upper chest: No apical lung mass Review of the MIP images confirms the above findings CTA HEAD FINDINGS Anterior circulation: Intracranial internal carotid arteries are patent with calcified plaque causing mild stenosis. Anterior and middle cerebral arteries are patent. Posterior circulation: Intracranial vertebral arteries are patent with mild calcified plaque. Basilar artery is patent. Posterior cerebral arteries are patent. Venous sinuses: As permitted by contrast timing, patent. Review of the MIP images confirms the above findings IMPRESSION: No acute  intracranial hemorrhage or evidence of acute infarction. Probable mild chronic microvascular ischemic changes. No large vessel occlusion or hemodynamically significant stenosis. Electronically Signed   By: Guadlupe Spanish M.D.   On: 10/30/2019 16:19   CT Angio Neck W and/or Wo Contrast  Result Date: 10/30/2019 CLINICAL DATA:  Right facial numbness EXAM: CT ANGIOGRAPHY HEAD AND NECK TECHNIQUE: Multidetector CT imaging of the head and neck was performed using the standard protocol during bolus administration of intravenous contrast. Multiplanar CT image reconstructions and MIPs were obtained to evaluate the vascular anatomy. Carotid stenosis measurements (when applicable) are obtained utilizing NASCET criteria, using the distal internal carotid diameter as the denominator. CONTRAST:  75mL OMNIPAQUE IOHEXOL 350 MG/ML SOLN COMPARISON:  None. FINDINGS: CT HEAD FINDINGS Brain: There is no acute intracranial hemorrhage, mass effect, or edema. Gray-white differentiation is preserved. Patchy hypoattenuation in the supratentorial white matter is nonspecific but may reflect mild chronic microvascular ischemic changes. Ventricles and sulci are normal in size and configuration. Vascular: No hyperdense vessel. Intracranial atherosclerotic calcification is present at the skull base. Skull: Unremarkable.  Likely chronic nasal bone irregularity. Sinuses: Aerated. Orbits: Unremarkable. Review of the MIP images confirms the above findings CTA NECK FINDINGS Aortic arch: Great vessel origins are patent. Right carotid system: Patent. Minimal plaque is present at the common carotid bifurcation and proximal ICA without measurable stenosis. Left carotid system: Patent. Minimal plaque is present at the bifurcation and proximal ICA without measurable stenosis. Vertebral arteries: Patent and codominant. Skeleton: Multilevel degenerative changes of the cervical spine. Other neck: No neck mass or adenopathy. Upper chest: No apical lung mass  Review of the MIP images confirms the above findings CTA HEAD FINDINGS Anterior circulation: Intracranial internal carotid arteries are patent with calcified plaque causing mild stenosis. Anterior and middle cerebral arteries are patent. Posterior circulation: Intracranial vertebral arteries are patent with mild calcified plaque. Basilar artery is patent. Posterior cerebral arteries are patent. Venous sinuses: As permitted by contrast timing, patent. Review of the MIP images confirms the above findings IMPRESSION: No acute intracranial hemorrhage or evidence of acute infarction. Probable mild chronic microvascular ischemic changes. No large vessel occlusion or hemodynamically significant stenosis. Electronically Signed   By: Guadlupe Spanish M.D.   On: 10/30/2019 16:19   MR BRAIN WO CONTRAST  Result Date: 10/30/2019 CLINICAL DATA:  Right facial numbness EXAM: MRI HEAD WITHOUT CONTRAST TECHNIQUE: Multiplanar, multiecho pulse sequences of the brain and surrounding structures were obtained without intravenous contrast. COMPARISON:  Correlation made with same day CTA FINDINGS: Brain: There is no acute infarction or intracranial hemorrhage. There is no intracranial mass, mass effect, or edema. There is no hydrocephalus or extra-axial fluid collection. Patchy foci of T2 hyperintensity in the supratentorial white matter are nonspecific but may reflect mild chronic microvascular ischemic changes. Small chronic left cerebellar infarcts are noted. Vascular: Major vessel flow voids at the skull base are preserved. Skull and upper cervical spine: Normal marrow signal is preserved. Sinuses/Orbits: Paranasal sinuses are aerated. Orbits are unremarkable. Other: Sella is unremarkable.  Mastoid air cells are clear. IMPRESSION: No acute infarction, intracranial hemorrhage, or mass. Mild chronic microvascular ischemic changes. Small chronic left cerebellar infarcts. Electronically Signed   By: Guadlupe Spanish M.D.   On: 10/30/2019  19:22   MR CERVICAL SPINE WO CONTRAST  Result Date: 10/31/2019 CLINICAL DATA:  Retro-orbital headache EXAM: MRI CERVICAL SPINE WITHOUT CONTRAST TECHNIQUE: Multiplanar, multisequence MR imaging of the cervical spine was performed. No intravenous contrast was administered. COMPARISON:  None. FINDINGS: Alignment: There is straightening of the normal cervical lordosis. There is a minimal anterolisthesis of C3 on C4 measuring 2 mm. Vertebrae: The vertebral body heights are well maintained. No fracture, marrow edema,or pathologic marrow infiltration. Fatty endplate changes with anterior osteophytes seen at C6-C7. Cord: Normal signal and morphology. Posterior Fossa, vertebral arteries, paraspinal tissues: The visualized portion of the posterior fossa is unremarkable. Normal flow voids seen within the vertebral arteries. The paraspinal soft tissues are unremarkable. Disc levels: C1-C2: Atlanto-axial junction is normal, without canal narrowing C2-C3: Disc osteophyte complex is seen which causes moderate left neural foraminal narrowing. C3-C4: Disc osteophyte complex and uncovertebral osteophytes are seen which causes severe left and mild right neural foraminal narrowing. C4-C5: Disc osteophyte complex and uncovertebral osteophytes are seen which causes mild bilateral neural foraminal narrowing. C5-C6: Disc osteophyte complex with a right paracentral disc protrusion is seen which contacts the exiting right C6 nerve root. There is severe bilateral neural foraminal narrowing. The central thecal sac is effaced measuring 5 mm in AP dimension. C6-C7: Disc osteophyte complex which is asymmetric to the right with uncovertebral osteophytes are seen which causes severe bilateral neural foraminal narrowing. The central thecal sac is effaced measuring 6 mm in AP diameter. C7-T1: Minimal disc osteophyte complex is seen which causes mild bilateral neural foraminal narrowing. IMPRESSION: Cervical spine spondylosis most notable at C5-C6  with a right paracentral disc protrusion contacting the exiting right C6 nerve root with severe bilateral neural foraminal narrowing and moderate central canal stenosis. Also at C6-C7 there is severe bilateral neural foraminal narrowing and moderate central canal stenosis. Electronically Signed   By: Jonna Clark M.D.   On: 10/31/2019 03:50   MR THORACIC SPINE WO CONTRAST  Result Date: 10/31/2019 CLINICAL DATA:  Headaches and gait disorder EXAM: MRI THORACIC SPINE WITHOUT CONTRAST TECHNIQUE: Multiplanar, multisequence MR imaging of the thoracic spine was performed. No intravenous contrast was administered. COMPARISON:  None. FINDINGS: Alignment:  Physiologic. Vertebrae: No fracture, evidence of discitis, or bone lesion. Cord:  Normal signal and morphology. Paraspinal and other soft tissues: Negative. Disc levels: No disc herniation or stenosis. IMPRESSION: Normal thoracic spine MRI. Electronically Signed   By: Deatra Robinson M.D.   On: 10/31/2019 04:05   ECHOCARDIOGRAM COMPLETE  Result Date: 10/31/2019   ECHOCARDIOGRAM REPORT   Patient Name:   JAYLISE PEEK Date of Exam: 10/31/2019 Medical Rec #:  [id number]    Height:       61.0 in Accession #:    [id number]   Weight:       185.2 lb Date of Birth:  07/27/1951    BSA:          1.83 m Patient Age:    68 years     BP:           183/97 mmHg Patient Gender: F            HR:           89 bpm. Exam Location:  Inpatient Procedure: 2D Echo Indications:  TIA 435.9  History:        Patient has no prior history of Echocardiogram examinations.                 Signs/Symptoms:history of murmur; Risk Factors:Hypertension.  Sonographer:    Delcie Roch Referring Phys: [id number] ARAVIND CHANDRA IMPRESSIONS  1. Left ventricular ejection fraction, by visual estimation, is 60 to 65%. The left ventricle has normal function. There is no left ventricular hypertrophy.  2. Left ventricular diastolic parameters are consistent with Grade I diastolic dysfunction (impaired relaxation).   3. The left ventricle has no regional wall motion abnormalities.  4. Global right ventricle has normal systolic function.The right ventricular size is normal.  5. Left atrial size was normal.  6. Right atrial size was normal.  7. The mitral valve is normal in structure. Trivial mitral valve regurgitation. No evidence of mitral stenosis.  8. The tricuspid valve is normal in structure.  9. The aortic valve has an indeterminant number of cusps. Aortic valve regurgitation is not visualized. No evidence of aortic valve sclerosis or stenosis. 10. The pulmonic valve was not well visualized. Pulmonic valve regurgitation is not visualized. 11. The inferior vena cava is normal in size with greater than 50% respiratory variability, suggesting right atrial pressure of 3 mmHg. 12. Normal LV systolic function; grade 1 diastolic dysfunction; proximal septal thickening with no LVOT gradient. FINDINGS  Left Ventricle: Left ventricular ejection fraction, by visual estimation, is 60 to 65%. The left ventricle has normal function. The left ventricle has no regional wall motion abnormalities. There is no left ventricular hypertrophy. Left ventricular diastolic parameters are consistent with Grade I diastolic dysfunction (impaired relaxation). Normal left atrial pressure. Right Ventricle: The right ventricular size is normal.Global RV systolic function is has normal systolic function. Left Atrium: Left atrial size was normal in size. Right Atrium: Right atrial size was normal in size Pericardium: There is no evidence of pericardial effusion. Mitral Valve: The mitral valve is normal in structure. Trivial mitral valve regurgitation. No evidence of mitral valve stenosis by observation. Tricuspid Valve: The tricuspid valve is normal in structure. Tricuspid valve regurgitation is not demonstrated. Aortic Valve: The aortic valve has an indeterminant number of cusps. Aortic valve regurgitation is not visualized. The aortic valve is  structurally normal, with no evidence of sclerosis or stenosis. Pulmonic Valve: The pulmonic valve was not well visualized. Pulmonic valve regurgitation is not visualized. Pulmonic regurgitation is not visualized. Aorta: The aortic root is normal in size and structure. Venous: The inferior vena cava is normal in size with greater than 50% respiratory variability, suggesting right atrial pressure of 3 mmHg. IAS/Shunts: No atrial level shunt detected by color flow Doppler. Additional Comments: Normal LV systolic function; grade 1 diastolic dysfunction; proximal septal thickening with no LVOT gradient.  LEFT VENTRICLE PLAX 2D LVIDd:         4.50 cm Diastology LVIDs:         3.30 cm LV e' lateral:   7.51 cm/s LV PW:         0.90 cm LV E/e' lateral: 9.5 LV IVS:        0.90 cm LV e' medial:    6.64 cm/s LV SV:         48 ml   LV E/e' medial:  10.7 LV SV Index:   24.89  RIGHT VENTRICLE RV S prime:     12.50 cm/s TAPSE (M-mode): 2.3 cm LEFT ATRIUM  Index       RIGHT ATRIUM           Index LA diam:        3.20 cm 1.75 cm/m  RA Area:     11.50 cm LA Vol (A2C):   35.8 ml 19.58 ml/m RA Volume:   25.00 ml  13.68 ml/m LA Vol (A4C):   35.2 ml 19.26 ml/m LA Biplane Vol: 35.8 ml 19.58 ml/m  AORTIC VALVE LVOT Vmax:   95.30 cm/s LVOT Vmean:  65.800 cm/s LVOT VTI:    0.193 m  AORTA Ao Root diam: 3.00 cm MITRAL VALVE MV Area (PHT): 3.12 cm              SHUNTS MV PHT:        70.47 msec            Systemic VTI: 0.19 m MV Decel Time: 243 msec MV E velocity: 71.10 cm/s  103 cm/s MV A velocity: 108.00 cm/s 70.3 cm/s MV E/A ratio:  0.66        1.5  Kirk Ruths MD Electronically signed by Kirk Ruths MD Signature Date/Time: 10/31/2019/10:51:18 AM    Final      Time Spent in minutes  30     Desiree Hane M.D on 10/31/2019 at 11:37 AM  To page go to [email or web address] - password [facility]

## 2019-10-31 NOTE — Progress Notes (Signed)
Carotid Duplex complete. Please see CV Proc tab for preliminary results. Levin Bacon- RDMS, RVT 11:53 AM  10/31/2019

## 2019-10-31 NOTE — ED Notes (Signed)
Pt returned from MRI °

## 2019-10-31 NOTE — Progress Notes (Addendum)
NEURO HOSPITALIST PROGRESS NOTE   Subjective: Patient awake, alert, in bed, NAD. HA has spread to across her entire forehead today and she does c/o some sinus pressure. Numbness and tingling in face is intermittent and is currently not present.   Exam: Vitals:   10/31/19 0610 10/31/19 1215  BP: (!) 183/97 (!) 175/103  Pulse:  (!) 103  Resp: 16 17  Temp: 98.1 F (36.7 C)   SpO2: 99%     Physical Exam  Constitutional: Appears well-developed and well-nourished.  Psych: Affect appropriate to situation Eyes: Normal external eye and conjunctiva. HENT: Normocephalic, no lesions, without obvious abnormality.   Musculoskeletal-no joint tenderness, right  forearm with  Soft cast Cardiovascular: Normal rate and regular rhythm.  Respiratory: Effort normal, non-labored breathing saturations WNL on RA GI: Soft.  No distension. There is no tenderness.  Skin: WDI   Neuro:  Mental Status: Alert, oriented, thought content appropriate.  Speech fluent without evidence of aphasia.  Able to follow  commands without difficulty. Cranial Nerves: II:  Visual fields grossly normal,  III,IV, VI: ptosis not present, extra-ocular motions intact bilaterally pupils equal, round, reactive to light and accommodation V,VII: smile symmetric, facial light touch sensation normal bilaterally VIII: hearing normal bilaterally IX,X: uvula rises symmetrically XI: bilateral shoulder shrug XII: midline tongue extension Motor Right : Upper extremity   5/5  Left:     Upper extremity   5/5  Lower extremity   5/5   Lower extremity   5/5 Tone and bulk:normal tone throughout; no atrophy noted Sensory:  light touch intact throughout, bilaterally Deep Tendon Reflexes: 3+ and symmetric , patella, 2+ biceps Plantars: Right: downgoing   Left: downgoing Cerebellar: No ataxia noted Gait: deferred    Medications:  Scheduled: .  stroke: mapping our early stages of recovery book   Does not apply Once   . allopurinol  100 mg Oral Q supper  . cholecalciferol  5,000 Units Oral QHS  . colchicine  0.6 mg Oral Daily  . enoxaparin (LOVENOX) injection  40 mg Subcutaneous Q24H  . levothyroxine  112 mcg Oral QAC breakfast   Continuous:  SVX:[id number] **OR** acetaminophen (TYLENOL) oral liquid 160 mg/5 mL **OR** acetaminophen, hydrALAZINE, menthol-cetylpyridinium, Muscle Rub, senna-docusate  Pertinent Labs/Diagnostics:   CT Angio Head W or Wo Contrast  Result Date: 10/30/2019 CLINICAL DATA:  Right facial numbness EXAM: CT ANGIOGRAPHY HEAD AND NECK TECHNIQUE: Multidetector CT imaging of the head and neck was performed using the standard protocol during bolus administration of intravenous contrast. Multiplanar CT image reconstructions and MIPs were obtained to evaluate the vascular anatomy. Carotid stenosis measurements (when applicable) are obtained utilizing NASCET criteria, using the distal internal carotid diameter as the denominator. CONTRAST:  31mL OMNIPAQUE IOHEXOL 350 MG/ML SOLN COMPARISON:  None. FINDINGS: CT HEAD FINDINGS Brain: There is no acute intracranial hemorrhage, mass effect, or edema. Gray-white differentiation is preserved. Patchy hypoattenuation in the supratentorial white matter is nonspecific but may reflect mild chronic microvascular ischemic changes. Ventricles and sulci are normal in size and configuration. Vascular: No hyperdense vessel. Intracranial atherosclerotic calcification is present at the skull base. Skull: Unremarkable.  Likely chronic nasal bone irregularity. Sinuses: Aerated. Orbits: Unremarkable. Review of the MIP images confirms the above findings CTA NECK FINDINGS Aortic arch: Great vessel origins are patent. Right carotid system: Patent. Minimal plaque is present at the common carotid bifurcation and proximal ICA without measurable  stenosis. Left carotid system: Patent. Minimal plaque is present at the bifurcation and proximal ICA without measurable stenosis.  Vertebral arteries: Patent and codominant. Skeleton: Multilevel degenerative changes of the cervical spine. Other neck: No neck mass or adenopathy. Upper chest: No apical lung mass Review of the MIP images confirms the above findings CTA HEAD FINDINGS Anterior circulation: Intracranial internal carotid arteries are patent with calcified plaque causing mild stenosis. Anterior and middle cerebral arteries are patent. Posterior circulation: Intracranial vertebral arteries are patent with mild calcified plaque. Basilar artery is patent. Posterior cerebral arteries are patent. Venous sinuses: As permitted by contrast timing, patent. Review of the MIP images confirms the above findings IMPRESSION: No acute intracranial hemorrhage or evidence of acute infarction. Probable mild chronic microvascular ischemic changes. No large vessel occlusion or hemodynamically significant stenosis. Electronically Signed   By: Guadlupe Spanish M.D.   On: 10/30/2019 16:19   CT Angio Neck W and/or Wo Contrast  Result Date: 10/30/2019 CLINICAL DATA:  Right facial numbness EXAM: CT ANGIOGRAPHY HEAD AND NECK TECHNIQUE: Multidetector CT imaging of the head and neck was performed using the standard protocol during bolus administration of intravenous contrast. Multiplanar CT image reconstructions and MIPs were obtained to evaluate the vascular anatomy. Carotid stenosis measurements (when applicable) are obtained utilizing NASCET criteria, using the distal internal carotid diameter as the denominator. CONTRAST:  54mL OMNIPAQUE IOHEXOL 350 MG/ML SOLN COMPARISON:  None. FINDINGS: CT HEAD FINDINGS Brain: There is no acute intracranial hemorrhage, mass effect, or edema. Gray-white differentiation is preserved. Patchy hypoattenuation in the supratentorial white matter is nonspecific but may reflect mild chronic microvascular ischemic changes. Ventricles and sulci are normal in size and configuration. Vascular: No hyperdense vessel. Intracranial  atherosclerotic calcification is present at the skull base. Skull: Unremarkable.  Likely chronic nasal bone irregularity. Sinuses: Aerated. Orbits: Unremarkable. Review of the MIP images confirms the above findings CTA NECK FINDINGS Aortic arch: Great vessel origins are patent. Right carotid system: Patent. Minimal plaque is present at the common carotid bifurcation and proximal ICA without measurable stenosis. Left carotid system: Patent. Minimal plaque is present at the bifurcation and proximal ICA without measurable stenosis. Vertebral arteries: Patent and codominant. Skeleton: Multilevel degenerative changes of the cervical spine. Other neck: No neck mass or adenopathy. Upper chest: No apical lung mass Review of the MIP images confirms the above findings CTA HEAD FINDINGS Anterior circulation: Intracranial internal carotid arteries are patent with calcified plaque causing mild stenosis. Anterior and middle cerebral arteries are patent. Posterior circulation: Intracranial vertebral arteries are patent with mild calcified plaque. Basilar artery is patent. Posterior cerebral arteries are patent. Venous sinuses: As permitted by contrast timing, patent. Review of the MIP images confirms the above findings IMPRESSION: No acute intracranial hemorrhage or evidence of acute infarction. Probable mild chronic microvascular ischemic changes. No large vessel occlusion or hemodynamically significant stenosis. Electronically Signed   By: Guadlupe Spanish M.D.   On: 10/30/2019 16:19   MR BRAIN WO CONTRAST  Result Date: 10/30/2019 CLINICAL DATA:  Right facial numbness EXAM: MRI HEAD WITHOUT CONTRAST TECHNIQUE: Multiplanar, multiecho pulse sequences of the brain and surrounding structures were obtained without intravenous contrast. COMPARISON:  Correlation made with same day CTA FINDINGS: Brain: There is no acute infarction or intracranial hemorrhage. There is no intracranial mass, mass effect, or edema. There is no  hydrocephalus or extra-axial fluid collection. Patchy foci of T2 hyperintensity in the supratentorial white matter are nonspecific but may reflect mild chronic microvascular ischemic changes. Small chronic left cerebellar infarcts  are noted. Vascular: Major vessel flow voids at the skull base are preserved. Skull and upper cervical spine: Normal marrow signal is preserved. Sinuses/Orbits: Paranasal sinuses are aerated. Orbits are unremarkable. Other: Sella is unremarkable.  Mastoid air cells are clear. IMPRESSION: No acute infarction, intracranial hemorrhage, or mass. Mild chronic microvascular ischemic changes. Small chronic left cerebellar infarcts. Electronically Signed   By: Guadlupe Spanish M.D.   On: 10/30/2019 19:22   MR CERVICAL SPINE WO CONTRAST  Result Date: 10/31/2019 CLINICAL DATA:  Retro-orbital headache EXAM: MRI CERVICAL SPINE WITHOUT CONTRAST TECHNIQUE: Multiplanar, multisequence MR imaging of the cervical spine was performed. No intravenous contrast was administered. COMPARISON:  None. FINDINGS: Alignment: There is straightening of the normal cervical lordosis. There is a minimal anterolisthesis of C3 on C4 measuring 2 mm. Vertebrae: The vertebral body heights are well maintained. No fracture, marrow edema,or pathologic marrow infiltration. Fatty endplate changes with anterior osteophytes seen at C6-C7. Cord: Normal signal and morphology. Posterior Fossa, vertebral arteries, paraspinal tissues: The visualized portion of the posterior fossa is unremarkable. Normal flow voids seen within the vertebral arteries. The paraspinal soft tissues are unremarkable. Disc levels: C1-C2: Atlanto-axial junction is normal, without canal narrowing C2-C3: Disc osteophyte complex is seen which causes moderate left neural foraminal narrowing. C3-C4: Disc osteophyte complex and uncovertebral osteophytes are seen which causes severe left and mild right neural foraminal narrowing. C4-C5: Disc osteophyte complex and  uncovertebral osteophytes are seen which causes mild bilateral neural foraminal narrowing. C5-C6: Disc osteophyte complex with a right paracentral disc protrusion is seen which contacts the exiting right C6 nerve root. There is severe bilateral neural foraminal narrowing. The central thecal sac is effaced measuring 5 mm in AP dimension. C6-C7: Disc osteophyte complex which is asymmetric to the right with uncovertebral osteophytes are seen which causes severe bilateral neural foraminal narrowing. The central thecal sac is effaced measuring 6 mm in AP diameter. C7-T1: Minimal disc osteophyte complex is seen which causes mild bilateral neural foraminal narrowing. 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 Also at C6-C7 there is severe bilateral neural foraminal narrowing and moderate central canal stenosis. Electronically Signed   By: Jonna Clark M.D.   On: 10/31/2019 03:50   MR THORACIC SPINE WO CONTRAST  Result Date: 10/31/2019 CLINICAL DATA:  Headaches and gait disorder EXAM: MRI THORACIC SPINE WITHOUT CONTRAST TECHNIQUE: Multiplanar, multisequence MR imaging of the thoracic spine was performed. No intravenous contrast was administered. COMPARISON:  None. FINDINGS: Alignment:  Physiologic. Vertebrae: No fracture, evidence of discitis, or bone lesion. Cord:  Normal signal and morphology. Paraspinal and other soft tissues: Negative. Disc levels: No disc herniation or stenosis. IMPRESSION: Normal thoracic spine MRI. Electronically Signed   By: Deatra Robinson M.D.   On: 10/31/2019 04:05   ECHOCARDIOGRAM COMPLETE  Result Date: 10/31/2019   ECHOCARDIOGRAM REPORT   Patient Name:   Kristy Gill Date of Exam: 10/31/2019 Medical Rec #:  [id number]    Height:       61.0 in Accession #:    [id number]   Weight:       185.2 lb Date of  Birth:  [birth year]    BSA:          1.83 m Patient Age:    68 years     BP:           183/97 mmHg Patient Gender:  F            HR:           89 bpm. Exam Location:  Inpatient Procedure: 2D Echo Indications:    TIA 435.9  History:        Patient has no prior history of Echocardiogram examinations.                 Signs/Symptoms:history of murmur; Risk Factors:Hypertension.  Sonographer:    Delcie Roch Referring Phys: [id number] ARAVIND CHANDRA IMPRESSIONS  1. Left ventricular ejection fraction, by visual estimation, is 60 to 65%. The left ventricle has normal function. There is no left ventricular hypertrophy.  2. Left ventricular diastolic parameters are consistent with Grade I diastolic dysfunction (impaired relaxation).  3. The left ventricle has no regional wall motion abnormalities.  4. Global right ventricle has normal systolic function.The right ventricular size is normal.  5. Left atrial size was normal.  6. Right atrial size was normal.  7. The mitral valve is normal in structure. Trivial mitral valve regurgitation. No evidence of mitral stenosis.  8. The tricuspid valve is normal in structure.  9. The aortic valve has an indeterminant number of cusps. Aortic valve regurgitation is not visualized. No evidence of aortic valve sclerosis or stenosis. 10. The pulmonic valve was not well visualized. Pulmonic valve regurgitation is not visualized. 11. The inferior vena cava is normal in size with greater than 50% respiratory variability, suggesting right atrial pressure of 3 mmHg. 12. Normal LV systolic function; grade 1 diastolic dysfunction; proximal septal thickening with no LVOT gradient. FINDINGS  Left Ventricle: Left ventricular ejection fraction, by visual estimation, is 60 to 65%. The left ventricle has normal function. The left ventricle has no regional wall motion abnormalities. There is no left ventricular hypertrophy. Left ventricular diastolic parameters are consistent with Grade I diastolic  dysfunction (impaired relaxation). Normal left atrial pressure. Right Ventricle: The right ventricular size is normal.Global RV systolic function is has normal systolic function. Left Atrium: Left atrial size was normal in size. Right Atrium: Right atrial size was normal in size Pericardium: There is no evidence of pericardial effusion. Mitral Valve: The mitral valve is normal in structure. Trivial mitral valve regurgitation. No evidence of mitral valve stenosis by observation. Tricuspid Valve: The tricuspid valve is normal in structure. Tricuspid valve regurgitation is not demonstrated. Aortic Valve: The aortic valve has an indeterminant number of cusps. Aortic valve regurgitation is not visualized. The aortic valve is structurally normal, with no evidence of sclerosis or stenosis. Pulmonic Valve: The pulmonic valve was not well visualized. Pulmonic valve regurgitation is not visualized. Pulmonic regurgitation is not visualized. Aorta: The aortic root is normal in size and structure. Venous: The inferior vena cava is normal in size with greater than 50% respiratory variability, suggesting right atrial pressure of 3 mmHg. IAS/Shunts: No atrial level shunt detected by color flow Doppler. Additional Comments: Normal LV systolic function; grade 1 diastolic dysfunction; proximal septal thickening with no LVOT gradient.  LEFT VENTRICLE PLAX 2D LVIDd:         4.50 cm Diastology LVIDs:         3.30 cm LV e' lateral:   7.51 cm/s LV PW:         0.90 cm LV E/e' lateral: 9.5 LV IVS:        0.90 cm LV e' medial:    6.64 cm/s LV SV:         48 ml   LV E/e' medial:  10.7 LV SV Index:   24.89  RIGHT VENTRICLE RV S prime:     12.50 cm/s TAPSE (M-mode): 2.3 cm LEFT ATRIUM             Index       RIGHT ATRIUM           Index LA diam:        3.20 cm 1.75 cm/m  RA Area:     11.50 cm LA Vol (A2C):   35.8 ml 19.58 ml/m RA Volume:   25.00 ml  13.68 ml/m LA Vol (A4C):   35.2 ml 19.26 ml/m LA Biplane Vol: 35.8 ml 19.58 ml/m  AORTIC  VALVE LVOT Vmax:   95.30 cm/s LVOT Vmean:  65.800 cm/s LVOT VTI:    0.193 m  AORTA Ao Root diam: 3.00 cm MITRAL VALVE MV Area (PHT): 3.12 cm              SHUNTS MV PHT:        70.47 msec            Systemic VTI: 0.19 m MV Decel Time: 243 msec MV E velocity: 71.10 cm/s  103 cm/s MV A velocity: 108.00 cm/s 70.3 cm/s MV E/A ratio:  0.66        1.5  Olga Millers MD Electronically signed by Olga Millers MD Signature Date/Time: 10/31/2019/10:51:18 AM    Final    VAS US CAROTID (at [facility] and [facility] only)  Result Date: 10/31/2019 Carotid Arterial Duplex Study Indications: TIA. Limitations  Today's exam was limited due to sunlight. Performing Technologist: Levada Schilling RDMS, RVT  Examination Guidelines: A complete evaluation includes B-mode imaging, spectral Doppler, color Doppler, and power Doppler as needed of all accessible portions of each vessel. Bilateral testing is considered an integral part of a complete examination. Limited examinations for reoccurring indications may be performed as noted.  Right Carotid Findings: +----------+-------+--------+--------+----------------------+------------------+           PSV    EDV cm/sStenosisPlaque Description    Comments                     cm/s                                                            +----------+-------+--------+--------+----------------------+------------------+ CCA Prox  96     19                                                       +----------+-------+--------+--------+----------------------+------------------+ CCA Distal76     16                                    intimal thickening +----------+-------+--------+--------+----------------------+------------------+ ICA Prox  94     22                                                       +----------+-------+--------+--------+----------------------+------------------+  ICA Mid   92     22                                                        +----------+-------+--------+--------+----------------------+------------------+ ICA Distal74     21                                                       +----------+-------+--------+--------+----------------------+------------------+ ECA       124    13              diffuse and                                                               heterogenous                             +----------+-------+--------+--------+----------------------+------------------+ +----------+--------+-------+--------+-------------------+           PSV cm/sEDV cmsDescribeArm Pressure (mmHG) +----------+--------+-------+--------+-------------------+ [id number]                                        +----------+--------+-------+--------+-------------------+ +---------+--------+--+--------+--+---------+ VertebralPSV cm/s63EDV cm/s12Antegrade +---------+--------+--+--------+--+---------+  Left Carotid Findings: +----------+--------+--------+--------+------------------------+--------+           PSV cm/sEDV cm/sStenosisPlaque Description      Comments +----------+--------+--------+--------+------------------------+--------+ CCA Prox  84      10                                               +----------+--------+--------+--------+------------------------+--------+ CCA Distal81      15              diffuse and heterogenous         +----------+--------+--------+--------+------------------------+--------+ ICA Prox  102     27                                               +----------+--------+--------+--------+------------------------+--------+ ICA Mid   87      17                                               +----------+--------+--------+--------+------------------------+--------+ ICA Distal80      25                                               +----------+--------+--------+--------+------------------------+--------+ ECA  112     12                                                +----------+--------+--------+--------+------------------------+--------+ +----------+--------+--------+--------+-------------------+           PSV cm/sEDV cm/sDescribeArm Pressure (mmHG) +----------+--------+--------+--------+-------------------+ [id number]                                         +----------+--------+--------+--------+-------------------+ +---------+--------+--+--------+--+---------+ VertebralPSV cm/s80EDV cm/s12Antegrade +---------+--------+--+--------+--+---------+  Summary: Right Carotid: Velocities in the right ICA are consistent with a 1-39% stenosis. Left Carotid: Velocities in the left ICA are consistent with a 1-39% stenosis. Vertebrals:  Bilateral vertebral arteries demonstrate antegrade flow. Subclavians: Normal flow hemodynamics were seen in bilateral subclavian              arteries. *See table(s) above for measurements and observations.     Preliminary    Assessment: 69 yo F with gait disorder and unilateral retro-orbital headache.  one possibility would be that this represents complicated migraine/ Given her history of spinal disease, as well as recent neck manipulations, I would favor ruling out spinal pathology. MRI T and C spine: Normal thoracic spine. Cervical spine spondylosis C5-C6 and C6-C7 with severe bilateral neural foraminal narrowing. Neurosurgery was consulted for above and did not feel this is causing patient's ataxia.   Recommendations: -- Compazine/Benadryl --continue PT, OT   Valentina Lucks, MSN, NP-C [facility] [phone]  Attending neurologist's note to follow  10/31/2019, 3:11 PM   NEUROHOSPITALIST ADDENDUM Performed a face to face diagnostic evaluation.   I have reviewed the contents of history and physical exam as documented by PA/ARNP/Resident and agree with above documentation.  I have discussed and formulated the above plan as documented. Edits to the note have been made  as needed.  60-year female with significant history of rheumatoid arthritis, currently diabetes mellitus, hypertension (well controlled), hypothyroidism, migraines who woke up on Saturday with pain around her right eye, throbbing, associated with nausea, not associated with photophobia.  She also noticed facial numbness on the right side and will try to get up she will need towards the right side.  She denies any sensation of vertigo/dizziness.  MRI brain was obtained to rule out stroke and was negative, noted a chronic small vessel disease as well as small chronic left cerebellar infarcts (patient denies any history of sudden onset ataxia in the past).  Thought process was migraine versus recrudescence of old stroke symptoms.  Patient also underwent MRI C and T-spine given her history of a spinal stenosis, shows significant C5-C6 as well as C6-C7 bilateral foraminal narrowing with moderate central canal stenosis evaluated by neurosurgery who did not feel that this would cause ataxia.  On examination,, patient does not have any dysmetria.  Does not have any nystagmus. Cranial nerves appear intact, motor strength 5/5 in left upper extremity, limited testing due to cast in the right upper extremity with atleast 4/5 strength in the proximal muscles. No significant weakness in either lower extremity.  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   Impression Sudden onset ataxia less likely due to peripheral vertigo as patient does denies any vertiginous symptoms.  Less likely to be explained by patient's old small chronic cerebellar  infarcts.  Given patient's risk factors.  Unlikely to be related to cervical canal stenosis in the absence of actual weakness  MRI negative stroke is a possibility with a small brainstem infarct versus MRI. Other considerations include basilar migraine given history of headache and facial numbness.  Plan We will try cocktail of valproic  acid, magnesium and Reglan.  Patient allergic to Decadron as well as Toradol. Outpatient neurology follow-up with repeat MRI in 4 weeks with thin cuts over the brainstem. Discussed starting aspirin 81 mg, patient declined due to significant bruising.  Patient also is intolerant to statins as well as Zetia. PT OT evaluation    Georgiana Spinner Valen Mascaro MD [facility] [phone]   If 7pm to 7am, please call on call as listed on AMION.

## 2019-10-31 NOTE — Evaluation (Signed)
Occupational Therapy Evaluation Patient Details Name: Kristy Gill MRN: [id number] DOB: 1951/03/17 Today's Date: 10/31/2019    History of Present Illness This 69 y.o. female with medical history significant of Rheumatoid Arthritis, HLD, T2DM, HTN, Hypothyroidism, and Gout who presented with headache, R. Facial numbness and ataxic gait.  Pt with recent wrist surgery and Rt UE casted. MRI of brain showed no acute infarct, but small chronic cerebellar infarcts.   MRI of cervical spine showed cervical spine spondylosis most noteable at C5-C6 with Rt paracentral disc potrusion contacting the exiting Rt C6 nerve Rt with severe bil. neural foraminal narrowing and moderate central canal stenosis.  Also at C6-C7 showed severe bilateral neural foraminal narrowing and moderate central canal stenosis.  Neuro surgery consulted.    Clinical Impression   Pt admitted with above. She demonstrates the below listed deficits and will benefit from continued OT to maximize safety and independence with BADLs.  Pt presents with significant balance deficits and Rt LE ataxia.  She requires min A for UB ADLs to to Rt lateral lean, and max A for LB ADLs due to imbalance and ataxia.  She required mod A +2 for functional mobility.  PTA, she lives with her son who works during the day, and has limited caregiver support.  Due to her risk of falls, recommend post acute rehab at discharge.       Follow Up Recommendations  CIR;Supervision/Assistance - 24 hour - If CIR not an options, recommend SNF as she is at significant risk for falls and injury    Equipment Recommendations  Wheelchair (measurements OT);Tub/shower bench    Recommendations for Other Services Rehab consult     Precautions / Restrictions Precautions Precautions: Fall Precaution Comments: up with assist only; s/p R wrist surgery w/cast in place Required Braces or Orthoses: Splint/Cast Splint/Cast: Rt wrist  Restrictions Weight Bearing Restrictions: No       Mobility Bed Mobility Overal bed mobility: Needs Assistance Bed Mobility: Supine to Sit;Sit to Sidelying     Supine to sit: Supervision   Sit to sidelying: Min assist General bed mobility comments: limited due to casted R wrist and needs slightly incr time; sidelying test for vestibular dysfunction revealed right sidelying to sit is limited due to vestib symptoms  Transfers Overall transfer level: Needs assistance Equipment used: 2 person hand held assist Transfers: Sit to/from Stand Sit to Stand: Mod assist;+2 safety/equipment         General transfer comment: demonstrates difficulty load bearing on RLE during transition to standing due to unreliable motor control impairments, and requires assist on standing to obtain and maintain balance    Balance Overall balance assessment: Needs assistance Sitting-balance support: Feet supported;No upper extremity supported Sitting balance-Leahy Scale: Good     Standing balance support: Bilateral upper extremity supported;During functional activity Standing balance-Leahy Scale: Poor Standing balance comment: dependent on external support to maintain static stance                           ADL either performed or assessed with clinical judgement   ADL Overall ADL's : Needs assistance/impaired Eating/Feeding: Modified independent;Bed level;Sitting   Grooming: Wash/dry hands;Wash/dry face;Oral care;Brushing hair;Supervision/safety;Min guard;Sitting Grooming Details (indicate cue type and reason): leans to the Rt  Upper Body Bathing: Minimal assistance;Sitting   Lower Body Bathing: Moderate assistance;Sit to/from stand   Upper Body Dressing : Minimal assistance;Sitting   Lower Body Dressing: Maximal assistance;Sit to/from stand   Toilet Transfer: Moderate assistance;+2 for  physical assistance;+2 for safety/equipment;Ambulation;Comfort height toilet   Toileting- Clothing Manipulation and Hygiene: Maximal assistance;Sit  to/from stand       Functional mobility during ADLs: Moderate assistance;+2 for physical assistance;+2 for safety/equipment General ADL Comments: Pt demonstrates significant balance deficits and Rt LE ataxia      Vision Baseline Vision/History: Wears glasses Wears Glasses: At all times Patient Visual Report: No change from baseline Vision Assessment?: Yes Eye Alignment: Within Functional Limits Ocular Range of Motion: Within Functional Limits Alignment/Gaze Preference: Within Defined Limits Tracking/Visual Pursuits: Able to track stimulus in all quads without difficulty Visual Fields: No apparent deficits     Perception Perception Perception Tested?: Yes   Praxis Praxis Praxis tested?: Within functional limits    Pertinent Vitals/Pain Pain Assessment: Faces Faces Pain Scale: No hurt     Hand Dominance Left   Extremity/Trunk Assessment Upper Extremity Assessment Upper Extremity Assessment: RUE deficits/detail RUE Deficits / Details: s/p wrist surgery w/cast in place   Lower Extremity Assessment Lower Extremity Assessment: Defer to PT evaluation RLE Deficits / Details: intact strength in isolation but functionally ataxic impacting gait RLE Sensation: WNL RLE Coordination: decreased gross motor(observed during gait)   Cervical / Trunk Assessment Cervical / Trunk Assessment: Normal   Communication Communication Communication: No difficulties   Cognition Arousal/Alertness: Awake/alert Behavior During Therapy: WFL for tasks assessed/performed Overall Cognitive Status: Within Functional Limits for tasks assessed                                     General Comments  BP 175/103    Exercises     Shoulder Instructions      Home Living Family/patient expects to be discharged to:: Private residence Living Arrangements: Children Available Help at Discharge: Family;Available PRN/intermittently Type of Home: House Home Access: Ramped entrance      Home Layout: Multi-level;Laundry or work area in basement;Full bath on main level;Able to live on main level with bedroom/bathroom Alternate Level Stairs-Number of Steps: flight   Bathroom Shower/Tub: Chief Strategy Officer: Standard         Additional Comments: 36yo son w/learning disabilities lives with pt, works days      Prior Functioning/Environment Level of Independence: Independent        Comments: former [employer]        OT Problem List: Impaired balance (sitting and/or standing);Decreased knowledge of use of DME or AE;Impaired UE functional use;Obesity      OT Treatment/Interventions: Self-care/ADL training;Neuromuscular education;DME and/or AE instruction;Therapeutic activities;Patient/family education;Balance training    OT Goals(Current goals can be found in the care plan section) Acute Rehab OT Goals Patient Stated Goal: Pt improve balance  OT Goal Formulation: With patient Time For Goal Achievement: 11/14/19 Potential to Achieve Goals: Good ADL Goals Pt Will Perform Grooming: (P) with min guard assist;standing Pt Will Perform Lower Body Bathing: (P) with min assist;sit to/from stand Pt Will Perform Lower Body Dressing: (P) with min assist;sit to/from stand Pt Will Transfer to Toilet: (P) with min assist;ambulating;regular height toilet;bedside commode;grab bars Pt Will Perform Toileting - Clothing Manipulation and hygiene: (P) with min assist;sit to/from stand  OT Frequency: Min 2X/week   Barriers to D/C: Decreased caregiver support          Co-evaluation PT/OT/SLP Co-Evaluation/Treatment: Yes Reason for Co-Treatment: For patient/therapist safety;To address functional/ADL transfers   OT goals addressed during session: ADL's and self-care      AM-PAC OT "  6 Clicks" Daily Activity     Outcome Measure Help from another person eating meals?: None Help from another person taking care of personal grooming?: A Little Help from another person  toileting, which includes using toliet, bedpan, or urinal?: A Lot Help from another person bathing (including washing, rinsing, drying)?: A Lot Help from another person to put on and taking off regular upper body clothing?: A Little Help from another person to put on and taking off regular lower body clothing?: A Lot 6 Click Score: 16   End of Session Nurse Communication: Mobility status  Activity Tolerance: Patient tolerated treatment well Patient left: in bed;Other (comment)(with PT )  OT Visit Diagnosis: Unsteadiness on feet (R26.81);Ataxia, unspecified (R27.0)                Time: 0712-1975 OT Time Calculation (min): 36 min Charges:  OT General Charges $OT Visit: 1 Visit OT Evaluation $OT Eval Moderate Complexity: 1 Mod  Nilsa Nutting., OTR/L Acute Rehabilitation Services Pager [phone] Office [phone]   Lucille Passy M 10/31/2019, 5:54 PM

## 2019-10-31 NOTE — Consult Note (Signed)
Chief Complaint   Chief Complaint  Patient presents with  . Weakness    Stroke like sx    HPI   Consult requested by: Dr Caleb Popp, [facility] Reason for consult: Cervical stenosis  HPI: Kristy Gill is a 69 y.o. female History of diabetes, hypertension, hypothyroidism, gout and RA who presented to the emergency room complaining of right-sided headache, facial numbness and tingling as well as ataxia.  She is s/p right thumb surgery this past Tuesday. Yesterday at 4:30 a.m. she woke up with right eye pain and right-sided facial numbness and tingling.   She took her prescribed pain medication went back to bed.  She woke up around 7:30 a.m. and was having difficulties walking.  She describes "veering to the right".she went to multiple urgent cares and finally ended up at [facility].  She was seen and evaluated by Dr. Amada Jupiter with neurology.  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  An MRI of her cervical and thoracic spine were obtained given history of rheumatoid arthritis.  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  A neurosurgical consultation was requested.  She reports that the numbness and tingling in her face has essentially resolved.  HA was also resolved, but she is starting to develop right periorbital headache again although not as severe.  Continues to have difficulties with ambulation but describes this as veering in to the right.  No weakness or numbness and tingling in her extremities.  She does have mild neck discomfort at times but none currently.  She sees PT for this.   She endorses chronic difficulties with bilateral hands due to rheumatoid arthritis but no acute worsening with coordination or fine motor skills.  Patient Active Problem List   Diagnosis Date Noted  . TIA (transient ischemic  attack) 10/30/2019    PMH: Past Medical History:  Diagnosis Date  . Arthritis   . Diabetes mellitus without complication (HCC)   . Gout   . Hypertension   . Thyroid disease    hypothryoidism    PSH: History reviewed. No pertinent surgical history.  Medications Prior to Admission  Medication Sig Dispense Refill Last Dose  . acetaminophen (TYLENOL) 500 MG tablet Take 1,000 mg by mouth daily as needed (pain).   10/30/2019 at am  . acetaminophen (TYLENOL) 650 MG CR tablet Take 1,300 mg by mouth at bedtime.   10/29/2019 at pm  . allopurinol (ZYLOPRIM) 100 MG tablet Take 100 mg by mouth daily with supper.   10/29/2019 at pm  . Cholecalciferol (VITAMIN D-3) 125 MCG (5000 UT) TABS Take 5,000 Units by mouth at bedtime.   10/29/2019 at pm  . colchicine 0.6 MG tablet Take 0.6 mg by mouth daily with lunch.   10/29/2019 at noon  . Cranberry (CRAN-MAX) 500 MG CAPS Take 500 mg by mouth daily after breakfast.   10/29/2019 at am  . cyclobenzaprine (FLEXERIL) 10 MG tablet Take 10 mg by mouth See admin instructions. Take one tablet (10 mg) by mouth daily at bedtime, may also take one tablet (10 mg) daily as needed for pain   10/29/2019 at pm  . diphenhydrAMINE (BENADRYL) 12.5 MG/5ML liquid Take 12.5 mg by mouth daily as needed for itching or allergies.   10/29/2019 at am  . EPINEPHrine (EPIPEN 2-PAK) 0.3 mg/0.3 mL IJ SOAJ injection Inject 0.3 mg into  the muscle once as needed for anaphylaxis (severe allergic reaction).   never taken  . levothyroxine (SYNTHROID) 112 MCG tablet Take 112 mcg by mouth daily before breakfast.   10/30/2019 at am  . Polyvinyl Alcohol-Povidone (REFRESH OP) Place 1 drop into both eyes daily as needed (itching/ dry eyes).   2 weeks ago  . Probiotic Product (PROBIOTIC PO) Take 1 tablet by mouth 3 (three) times a week.   10/28/2019  . traMADol (ULTRAM) 50 MG tablet Take 50 mg by mouth every 8 (eight) hours as needed (pain).   10/29/2019 at pm  . TURMERIC PO Take 1,500 mg by mouth daily.   10/29/2019 at am     SH: Social History   Tobacco Use  . Smoking status: Never Smoker  . Smokeless tobacco: Never Used  Substance Use Topics  . Alcohol use: Never  . Drug use: Never    MEDS: Prior to Admission medications   Medication Sig Start Date End Date Taking? Authorizing Provider  acetaminophen (TYLENOL) 500 MG tablet Take 1,000 mg by mouth daily as needed (pain).   Yes [provider]  acetaminophen (TYLENOL) 650 MG CR tablet Take 1,300 mg by mouth at bedtime.   Yes [provider]  allopurinol (ZYLOPRIM) 100 MG tablet Take 100 mg by mouth daily with supper.   Yes [provider]  Cholecalciferol (VITAMIN D-3) 125 MCG (5000 UT) TABS Take 5,000 Units by mouth at bedtime.   Yes [provider]  colchicine 0.6 MG tablet Take 0.6 mg by mouth daily with lunch.   Yes [provider]  Cranberry (CRAN-MAX) 500 MG CAPS Take 500 mg by mouth daily after breakfast.   Yes [provider]  cyclobenzaprine (FLEXERIL) 10 MG tablet Take 10 mg by mouth See admin instructions. Take one tablet (10 mg) by mouth daily at bedtime, may also take one tablet (10 mg) daily as needed for pain   Yes [provider]  diphenhydrAMINE (BENADRYL) 12.5 MG/5ML liquid Take 12.5 mg by mouth daily as needed for itching or allergies.   Yes [provider]  EPINEPHrine (EPIPEN 2-PAK) 0.3 mg/0.3 mL IJ SOAJ injection Inject 0.3 mg into the muscle once as needed for anaphylaxis (severe allergic reaction).   Yes [provider]  levothyroxine (SYNTHROID) 112 MCG tablet Take 112 mcg by mouth daily before breakfast.   Yes [provider]  Polyvinyl Alcohol-Povidone (REFRESH OP) Place 1 drop into both eyes daily as needed (itching/ dry eyes).   Yes [provider]  Probiotic Product (PROBIOTIC PO) Take 1 tablet by mouth 3 (three) times a week.   Yes [provider]  traMADol (ULTRAM) 50 MG tablet Take 50 mg by mouth every 8 (eight) hours  as needed (pain).   Yes [provider]  TURMERIC PO Take 1,500 mg by mouth daily.   Yes [provider]    ALLERGY: Allergies  Allergen Reactions  . Ceclor [Cefaclor] Shortness Of Breath, Itching and Rash  . Metformin And Related Shortness Of Breath, Rash and Other (See Comments)    Fatigue, scalp rash, shakiness  . Voltaren [Diclofenac] Shortness Of Breath and Swelling    Reaction to gel - throat swelling  . Morphine And Related Nausea And Vomiting    Projectile vomiting  . Amlodipine Swelling and Other (See Comments)    Minor shortness of breath, fatigue, headache,   . Arava [Leflunomide] Nausea And Vomiting and Other (See Comments)    Chest pain, tachycardia, elevated bp  .  Atorvastatin Other (See Comments)    Severe joint pain and abdominal pain  . Boswellia Hives  . Cephalosporins Other (See Comments)    Unknown reaction  . Clindamycin/Lincomycin Diarrhea    Severe diarrhea  . Gemfibrozil Itching, Nausea And Vomiting and Other (See Comments)    Muscle pain, hot flashes  . Lisinopril Cough  . Lodine [Etodolac] Other (See Comments)    Stomach and abdominal pain, mouth ulcers  . Medrol [Methylprednisolone] Other (See Comments)    Stomach pain/ tachycardia  . Methotrexate Derivatives Other (See Comments)    Fatigue/ GI symptoms/ headache/ muscle pain/ face and scalp rash, mouth ulcers  . Niacin And Related Other (See Comments)    Flushing, heartburn, insomnia, joint pain, elevated heart rate  . Plaquenil [Hydroxychloroquine] Itching and Other (See Comments)    Blisters on scalp   . Pravastatin Other (See Comments)    Muscle pain  . Rayos [Prednisone] Other (See Comments)    Stomach pain, tachycardia, muscle spasms, parathesia  . Sulfa Antibiotics Other (See Comments)    Unknown reaction  . Unasyn [Ampicillin-Sulbactam Sodium] Itching and Other (See Comments)    Blisters on scalp   . Zocor [Simvastatin] Diarrhea and Other (See Comments)    Abdominal  pain,   . Actemra [Tocilizumab] Rash and Other (See Comments)    Facial rash, rash at injection site, rectal bleeding, headache  . Baclofen Rash    Petechial face rash  . Enbrel [Etanercept] Rash and Other (See Comments)    Redness, swelling at injection site, tightness in throat w/ mild respiratory distress, muscle spasms, bell's palsy, discoid lupus  . Ibuprofen Swelling and Rash    Swollen face  . Other Itching, Rash and Other (See Comments)    monsel solution caused rash, itching  . Penicillins Rash    Did it involve swelling of the face/tongue/throat, SOB, or low BP? No Did it involve sudden or severe rash/hives, skin peeling, or any reaction on the inside of your mouth or nose? Yes Did you need to seek medical attention at a hospital or doctor's office? No When did it last happen?approx 69 years old If all above answers are "NO", may proceed with cephalosporin use.  . Simponi [Golimumab] Hives, Itching and Rash  . Zetia [Ezetimibe] Rash, Other (See Comments) and Cough    Headache, itchy rash, fatigue, dry mouth, dry cough    Social History   Tobacco Use  . Smoking status: Never Smoker  . Smokeless tobacco: Never Used  Substance Use Topics  . Alcohol use: Never     History reviewed. No pertinent family history.   ROS   Review of Systems  Constitutional: Negative.   HENT: Negative.   Eyes: Positive for photophobia. Negative for blurred vision and double vision.  Respiratory: Negative.   Cardiovascular: Negative.   Gastrointestinal: Negative.   Genitourinary: Negative.   Musculoskeletal: Negative for back pain, myalgias and neck pain.  Skin: Negative.   Neurological: Positive for tingling (right facial). Negative for dizziness, tremors, sensory change, speech change, focal weakness, seizures, loss of consciousness, weakness and headaches.    Exam   Vitals:   10/31/19 0610 10/31/19 1215  BP: (!) 183/97 (!) 175/103  Pulse:  (!) 103  Resp: 16 17  Temp:  98.1 F (36.7 C)   SpO2: 99%    General appearance: WDWN, NAD Eyes: No scleral injection Cardiovascular: Regular rate and rhythm without murmurs, rubs, gallops. No edema or variciosities. Distal pulses normal. Pulmonary: Effort normal, non-labored  breathing Musculoskeletal:     Muscle tone upper extremities: Normal    Muscle tone lower extremities: Normal    Motor exam: Upper Extremities Deltoid Bicep Tricep Grip  Right 5/5 5/5 5/5  in splint  Left 5/5 5/5 5/5 5/5   Lower Extremity IP Quad PF DF EHL  Right 5/5 5/5 5/5 5/5 5/5  Left 5/5 5/5 5/5 5/5 5/5   Neurological Mental Status:    - Patient is awake, alert, oriented to person, place, month, year, and situation    - Patient is able to give a clear and coherent history.    - No signs of aphasia or neglect Cranial Nerves    - II: Visual Fields are full. PERRL    - III/IV/VI: EOMI without ptosis or diploplia.     - V: Facial sensation is grossly normal    - VII: Facial movement is symmetric.     - VIII: hearing is intact to voice    - X: Uvula elevates symmetrically    - XI: Shoulder shrug is symmetric.    - XII: tongue is midline without atrophy or fasciculations.  Sensory: Sensation grossly intact to LT Deep Tendon Reflexes    - 3+ and symmetric in the biceps and patellae. Plantars   - Toes are downgoing bilaterally.  Cerebellar    - FNF and HKS are intact bilaterally   Results - Imaging/Labs   Results for orders placed or performed during the hospital encounter of 10/30/19 (from the past 48 hour(s))  CBG monitoring, ED     Status: Abnormal   Collection Time: 10/30/19 12:52 PM  Result Value Ref Range   Glucose-Capillary 128 (H) 70 - 99 mg/dL  Urine rapid drug screen (hosp performed)     Status: None   Collection Time: 10/30/19  1:50 PM  Result Value Ref Range   Opiates NONE DETECTED NONE DETECTED   Cocaine NONE DETECTED NONE DETECTED   Benzodiazepines NONE DETECTED NONE DETECTED   Amphetamines NONE DETECTED  NONE DETECTED   Tetrahydrocannabinol NONE DETECTED NONE DETECTED   Barbiturates NONE DETECTED NONE DETECTED    Comment: (NOTE) DRUG SCREEN FOR MEDICAL PURPOSES ONLY.  IF CONFIRMATION IS NEEDED FOR ANY PURPOSE, NOTIFY LAB WITHIN 5 DAYS. LOWEST DETECTABLE LIMITS FOR URINE DRUG SCREEN Drug Class                     Cutoff (ng/mL) Amphetamine and metabolites    1000 Barbiturate and metabolites    200 Benzodiazepine                 200 Tricyclics and metabolites     300 Opiates and metabolites        300 Cocaine and metabolites        300 THC                            50 Performed at [facility], [address] [place], [place] [address]   Urinalysis, Routine w reflex microscopic     Status: Abnormal   Collection Time: 10/30/19  1:50 PM  Result Value Ref Range   Color, Urine YELLOW YELLOW   APPearance HAZY (A) CLEAR   Specific Gravity, Urine 1.016 1.005 - 1.030   pH 5.0 5.0 - 8.0   Glucose, UA NEGATIVE NEGATIVE mg/dL   Hgb urine dipstick SMALL (A) NEGATIVE   Bilirubin Urine NEGATIVE NEGATIVE   Ketones, ur NEGATIVE NEGATIVE  mg/dL   Protein, ur 30 (A) NEGATIVE mg/dL   Nitrite POSITIVE (A) NEGATIVE   Leukocytes,Ua TRACE (A) NEGATIVE   RBC / HPF 0-5 0 - 5 RBC/hpf   WBC, UA 0-5 0 - 5 WBC/hpf   Bacteria, UA NONE SEEN NONE SEEN   Squamous Epithelial / LPF 0-5 0 - 5    Comment: Performed at [facility], [address] [place], [place] [address]  Ethanol     Status: None   Collection Time: 10/30/19  2:15 PM  Result Value Ref Range   Alcohol, Ethyl (B) <10 <10 mg/dL    Comment: (NOTE) Lowest detectable limit for serum alcohol is 10 mg/dL. For medical purposes only. Performed at [facility], [address] [place], [place] [address]   Protime-INR     Status: None   Collection Time: 10/30/19  2:15 PM  Result Value Ref Range   Prothrombin Time 12.7 11.4 - 15.2 seconds   INR 1.0 0.8 - 1.2    Comment: (NOTE) INR goal varies based on device and disease  states. Performed at [facility], [address] [place], [place] [address]   APTT     Status: None   Collection Time: 10/30/19  2:15 PM  Result Value Ref Range   aPTT 28 24 - 36 seconds    Comment: Performed at [facility], [address], [place], [place] [address]  Comprehensive metabolic panel     Status: Abnormal   Collection Time: 10/30/19  2:15 PM  Result Value Ref Range   Sodium 135 135 - 145 mmol/L   Potassium 3.7 3.5 - 5.1 mmol/L   Chloride 95 (L) 98 - 111 mmol/L   CO2 27 22 - 32 mmol/L   Glucose, Bld 139 (H) 70 - 99 mg/dL   BUN 12 8 - 23 mg/dL   Creatinine, Ser 9.67 0.44 - 1.00 mg/dL   Calcium 9.7 8.9 - 89.3 mg/dL   Total Protein 8.0 6.5 - 8.1 g/dL   Albumin 4.2 3.5 - 5.0 g/dL   AST 21 15 - 41 U/L   ALT 26 0 - 44 U/L   Alkaline Phosphatase 75 38 - 126 U/L   Total Bilirubin 0.8 0.3 - 1.2 mg/dL   GFR calc non Af Amer >60 >60 mL/min   GFR calc Af Amer >60 >60 mL/min   Anion gap 13 5 - 15    Comment: Performed at [facility], [address] [place], [place] [address]  CBC with Differential/Platelet     Status: None   Collection Time: 10/30/19  2:15 PM  Result Value Ref Range   WBC 9.1 4.0 - 10.5 K/uL   RBC 5.08 3.87 - 5.11 MIL/uL   Hemoglobin 14.6 12.0 - 15.0 g/dL   HCT 51.0 25.8 - 52.7 %   MCV 88.2 80.0 - 100.0 fL   MCH 28.7 26.0 - 34.0 pg   MCHC 32.6 30.0 - 36.0 g/dL   RDW 78.2 42.3 - 53.6 %   Platelets 235 150 - 400 K/uL   nRBC 0.0 0.0 - 0.2 %   Neutrophils Relative % 74 %   Neutro Abs 6.8 1.7 - 7.7 K/uL   Lymphocytes Relative 16 %   Lymphs Abs 1.5 0.7 - 4.0 K/uL   Monocytes Relative 6 %   Monocytes Absolute 0.6 0.1 - 1.0 K/uL   Eosinophils Relative 2 %   Eosinophils Absolute 0.2 0.0 - 0.5 K/uL  Basophils Relative 1 %   Basophils Absolute 0.1 0.0 - 0.1 K/uL   Immature Granulocytes 1 %   Abs Immature Granulocytes 0.05 0.00 - 0.07 K/uL    Comment: Performed at [facility], [address] [place], [place] [address]  I-stat  chem 8, ED     Status: Abnormal   Collection Time: 10/30/19  2:21 PM  Result Value Ref Range   Sodium 136 135 - 145 mmol/L   Potassium 3.9 3.5 - 5.1 mmol/L   Chloride 98 98 - 111 mmol/L   BUN 14 8 - 23 mg/dL   Creatinine, Ser 5.78 0.44 - 1.00 mg/dL   Glucose, Bld 469 (H) 70 - 99 mg/dL   Calcium, Ion 6.29 (L) 1.15 - 1.40 mmol/L   TCO2 29 22 - 32 mmol/L   Hemoglobin 15.3 (H) 12.0 - 15.0 g/dL   HCT 52.8 41.3 - 24.4 %  SARS CORONAVIRUS 2 (TAT 6-24 HRS) Nasopharyngeal Nasopharyngeal Swab     Status: None   Collection Time: 10/30/19  9:39 PM   Specimen: Nasopharyngeal Swab  Result Value Ref Range   SARS Coronavirus 2 NEGATIVE NEGATIVE    Comment: (NOTE) SARS-CoV-2 target nucleic acids are NOT DETECTED. The SARS-CoV-2 RNA is generally detectable in upper and lower respiratory specimens during the acute phase of infection. Negative results do not preclude SARS-CoV-2 infection, do not rule out co-infections with other pathogens, and should not be used as the sole basis for treatment or other patient management decisions. Negative results must be combined with clinical observations, patient history, and epidemiological information. The expected result is Negative. Fact Sheet for Patients: HairSlick.no Fact Sheet for Healthcare Providers: quierodirigir.com This test is not yet approved or cleared by the [place] FDA and  has been authorized for detection and/or diagnosis of SARS-CoV-2 by FDA under an Emergency Use Authorization (EUA). This EUA will remain  in effect (meaning this test can be used) for the duration of the COVID-19 declaration under Section 56 4(b)(1) of the Act, 21 U.S.C. section 360bbb-3(b)(1), unless the authorization is terminated or revoked sooner. Performed at [facility], [address] [place], [place] [address]   HIV Antibody (routine testing w rflx)     Status: None   Collection Time: 10/31/19   4:29 AM  Result Value Ref Range   HIV Screen 4th Generation wRfx NON REACTIVE NON REACTIVE    Comment: Performed at [facility], [address] [place], [place] [address]  Hemoglobin A1c     Status: Abnormal   Collection Time: 10/31/19  4:29 AM  Result Value Ref Range   Hgb A1c MFr Bld 6.7 (H) 4.8 - 5.6 %    Comment: (NOTE) Pre diabetes:          5.7%-6.4% Diabetes:              >6.4% Glycemic control for   <7.0% adults with diabetes    Mean Plasma Glucose 145.59 mg/dL    Comment: Performed at [facility], [address], [place], [place] [address]  Lipid panel     Status: Abnormal   Collection Time: 10/31/19  4:30 AM  Result Value Ref Range   Cholesterol 239 (H) 0 - 200 mg/dL   Triglycerides 742 (H) <150 mg/dL   HDL 42 >59 mg/dL   Total CHOL/HDL Ratio 5.7 RATIO   VLDL 40 0 - 40 mg/dL   LDL Cholesterol 563 (H) 0 - 99 mg/dL  Comment:        Total Cholesterol/HDL:CHD Risk Coronary Heart Disease Risk Table                     Men   Women  1/2 Average Risk   3.4   3.3  Average Risk       5.0   4.4  2 X Average Risk   9.6   7.1  3 X Average Risk  23.4   11.0        Use the calculated Patient Ratio above and the CHD Risk Table to determine the patient's CHD Risk.        ATP III CLASSIFICATION (LDL):  <100     mg/dL   Optimal  595-638  mg/dL   Near or Above                    Optimal  130-159  mg/dL   Borderline  756-433  mg/dL   High  >295     mg/dL   Very High Performed at [facility], [address], [place], [place] [address]     CT Angio Head W or Wo Contrast  Result Date: 10/30/2019 CLINICAL DATA:  Right facial numbness EXAM: CT ANGIOGRAPHY HEAD AND NECK TECHNIQUE: Multidetector CT imaging of the head and neck was performed using the standard protocol during bolus administration of intravenous contrast. Multiplanar CT image reconstructions and MIPs were obtained to evaluate the vascular anatomy. Carotid stenosis measurements (when applicable)  are obtained utilizing NASCET criteria, using the distal internal carotid diameter as the denominator. CONTRAST:  75mL OMNIPAQUE IOHEXOL 350 MG/ML SOLN COMPARISON:  None. FINDINGS: CT HEAD FINDINGS Brain: There is no acute intracranial hemorrhage, mass effect, or edema. Gray-white differentiation is preserved. Patchy hypoattenuation in the supratentorial white matter is nonspecific but may reflect mild chronic microvascular ischemic changes. Ventricles and sulci are normal in size and configuration. Vascular: No hyperdense vessel. Intracranial atherosclerotic calcification is present at the skull base. Skull: Unremarkable.  Likely chronic nasal bone irregularity. Sinuses: Aerated. Orbits: Unremarkable. Review of the MIP images confirms the above findings CTA NECK FINDINGS Aortic arch: Great vessel origins are patent. Right carotid system: Patent. Minimal plaque is present at the common carotid bifurcation and proximal ICA without measurable stenosis. Left carotid system: Patent. Minimal plaque is present at the bifurcation and proximal ICA without measurable stenosis. Vertebral arteries: Patent and codominant. Skeleton: Multilevel degenerative changes of the cervical spine. Other neck: No neck mass or adenopathy. Upper chest: No apical lung mass Review of the MIP images confirms the above findings CTA HEAD FINDINGS Anterior circulation: Intracranial internal carotid arteries are patent with calcified plaque causing mild stenosis. Anterior and middle cerebral arteries are patent. Posterior circulation: Intracranial vertebral arteries are patent with mild calcified plaque. Basilar artery is patent. Posterior cerebral arteries are patent. Venous sinuses: As permitted by contrast timing, patent. Review of the MIP images confirms the above findings IMPRESSION: No acute intracranial hemorrhage or evidence of acute infarction. Probable mild chronic microvascular ischemic changes. No large vessel occlusion or  hemodynamically significant stenosis. Electronically Signed   By: Guadlupe Spanish M.D.   On: 10/30/2019 16:19   CT Angio Neck W and/or Wo Contrast  Result Date: 10/30/2019 CLINICAL DATA:  Right facial numbness EXAM: CT ANGIOGRAPHY HEAD AND NECK TECHNIQUE: Multidetector CT imaging of the head and neck was performed using the standard protocol during bolus administration of intravenous contrast. Multiplanar CT image reconstructions and MIPs  were obtained to evaluate the vascular anatomy. Carotid stenosis measurements (when applicable) are obtained utilizing NASCET criteria, using the distal internal carotid diameter as the denominator. CONTRAST:  75mL OMNIPAQUE IOHEXOL 350 MG/ML SOLN COMPARISON:  None. FINDINGS: CT HEAD FINDINGS Brain: There is no acute intracranial hemorrhage, mass effect, or edema. Gray-white differentiation is preserved. Patchy hypoattenuation in the supratentorial white matter is nonspecific but may reflect mild chronic microvascular ischemic changes. Ventricles and sulci are normal in size and configuration. Vascular: No hyperdense vessel. Intracranial atherosclerotic calcification is present at the skull base. Skull: Unremarkable.  Likely chronic nasal bone irregularity. Sinuses: Aerated. Orbits: Unremarkable. Review of the MIP images confirms the above findings CTA NECK FINDINGS Aortic arch: Great vessel origins are patent. Right carotid system: Patent. Minimal plaque is present at the common carotid bifurcation and proximal ICA without measurable stenosis. Left carotid system: Patent. Minimal plaque is present at the bifurcation and proximal ICA without measurable stenosis. Vertebral arteries: Patent and codominant. Skeleton: Multilevel degenerative changes of the cervical spine. Other neck: No neck mass or adenopathy. Upper chest: No apical lung mass Review of the MIP images confirms the above findings CTA HEAD FINDINGS Anterior circulation: Intracranial internal carotid arteries are patent  with calcified plaque causing mild stenosis. Anterior and middle cerebral arteries are patent. Posterior circulation: Intracranial vertebral arteries are patent with mild calcified plaque. Basilar artery is patent. Posterior cerebral arteries are patent. Venous sinuses: As permitted by contrast timing, patent. Review of the MIP images confirms the above findings IMPRESSION: No acute intracranial hemorrhage or evidence of acute infarction. Probable mild chronic microvascular ischemic changes. No large vessel occlusion or hemodynamically significant stenosis. Electronically Signed   By: Guadlupe Spanish M.D.   On: 10/30/2019 16:19   MR BRAIN WO CONTRAST  Result Date: 10/30/2019 CLINICAL DATA:  Right facial numbness EXAM: MRI HEAD WITHOUT CONTRAST TECHNIQUE: Multiplanar, multiecho pulse sequences of the brain and surrounding structures were obtained without intravenous contrast. COMPARISON:  Correlation made with same day CTA FINDINGS: Brain: There is no acute infarction or intracranial hemorrhage. There is no intracranial mass, mass effect, or edema. There is no hydrocephalus or extra-axial fluid collection. Patchy foci of T2 hyperintensity in the supratentorial white matter are nonspecific but may reflect mild chronic microvascular ischemic changes. Small chronic left cerebellar infarcts are noted. Vascular: Major vessel flow voids at the skull base are preserved. Skull and upper cervical spine: Normal marrow signal is preserved. Sinuses/Orbits: Paranasal sinuses are aerated. Orbits are unremarkable. Other: Sella is unremarkable.  Mastoid air cells are clear. IMPRESSION: No acute infarction, intracranial hemorrhage, or mass. Mild chronic microvascular ischemic changes. Small chronic left cerebellar infarcts. Electronically Signed   By: Guadlupe Spanish M.D.   On: 10/30/2019 19:22   MR CERVICAL SPINE WO CONTRAST  Result Date: 10/31/2019 CLINICAL DATA:  Retro-orbital headache EXAM: MRI CERVICAL SPINE WITHOUT  CONTRAST TECHNIQUE: Multiplanar, multisequence MR imaging of the cervical spine was performed. No intravenous contrast was administered. COMPARISON:  None. FINDINGS: Alignment: There is straightening of the normal cervical lordosis. There is a minimal anterolisthesis of C3 on C4 measuring 2 mm. Vertebrae: The vertebral body heights are well maintained. No fracture, marrow edema,or pathologic marrow infiltration. Fatty endplate changes with anterior osteophytes seen at C6-C7. Cord: Normal signal and morphology. Posterior Fossa, vertebral arteries, paraspinal tissues: The visualized portion of the posterior fossa is unremarkable. Normal flow voids seen within the vertebral arteries. The paraspinal soft tissues are unremarkable. Disc levels: C1-C2: Atlanto-axial junction is normal, without canal narrowing C2-C3:  Disc osteophyte complex is seen which causes moderate left neural foraminal narrowing. C3-C4: Disc osteophyte complex and uncovertebral osteophytes are seen which causes severe left and mild right neural foraminal narrowing. C4-C5: Disc osteophyte complex and uncovertebral osteophytes are seen which causes mild bilateral neural foraminal narrowing. C5-C6: Disc osteophyte complex with a right paracentral disc protrusion is seen which contacts the exiting right C6 nerve root. There is severe bilateral neural foraminal narrowing. The central thecal sac is effaced measuring 5 mm in AP dimension. C6-C7: Disc osteophyte complex which is asymmetric to the right with uncovertebral osteophytes are seen which causes severe bilateral neural foraminal narrowing. The central thecal sac is effaced measuring 6 mm in AP diameter. C7-T1: Minimal disc osteophyte complex is seen which causes mild bilateral neural foraminal narrowing. 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 Also at C6-C7 there is severe bilateral neural foraminal narrowing and moderate central canal stenosis. Electronically Signed   By: Jonna Clark M.D.   On: 10/31/2019 03:50   MR THORACIC SPINE WO CONTRAST  Result Date: 10/31/2019 CLINICAL DATA:  Headaches and gait disorder EXAM: MRI THORACIC SPINE WITHOUT CONTRAST TECHNIQUE: Multiplanar, multisequence MR imaging of the thoracic spine was performed. No intravenous contrast was administered. COMPARISON:  None. FINDINGS: Alignment:  Physiologic. Vertebrae: No fracture, evidence of discitis, or bone lesion. Cord:  Normal signal and morphology. Paraspinal and other soft tissues: Negative. Disc levels: No disc herniation or stenosis. IMPRESSION: Normal thoracic spine MRI. Electronically Signed   By: Deatra Robinson M.D.   On: 10/31/2019 04:05   ECHOCARDIOGRAM COMPLETE  Result Date: 10/31/2019   ECHOCARDIOGRAM REPORT   Patient Name:   CLEOFAS HAYNER Date of Exam: 10/31/2019 Medical Rec #:  [id number]    Height:       61.0 in Accession #:    [id number]   Weight:       185.2 lb Date of Birth:  10-Dec-1950    BSA:          1.83 m Patient Age:    68 years     BP:           183/97 mmHg Patient Gender: F            HR:           89 bpm. Exam Location:  Inpatient Procedure: 2D Echo Indications:    TIA 435.9  History:        Patient has no prior history of Echocardiogram examinations.                 Signs/Symptoms:history of murmur; Risk Factors:Hypertension.  Sonographer:    Delcie Roch Referring Phys: [id number] ARAVIND CHANDRA IMPRESSIONS  1. Left ventricular ejection fraction, by visual estimation, is 60 to 65%. The left ventricle has normal function. There is no left ventricular hypertrophy.  2. Left ventricular diastolic parameters are consistent with Grade I diastolic dysfunction (impaired relaxation).  3. The left ventricle has no regional wall motion abnormalities.  4. Global right ventricle has normal systolic function.The right  ventricular size is normal.  5. Left atrial size was normal.  6. Right atrial size was normal.  7. The mitral valve is normal in structure. Trivial mitral valve regurgitation. No evidence of mitral stenosis.  8. The tricuspid valve is normal in structure.  9. The aortic valve has an indeterminant number of  cusps. Aortic valve regurgitation is not visualized. No evidence of aortic valve sclerosis or stenosis. 10. The pulmonic valve was not well visualized. Pulmonic valve regurgitation is not visualized. 11. The inferior vena cava is normal in size with greater than 50% respiratory variability, suggesting right atrial pressure of 3 mmHg. 12. Normal LV systolic function; grade 1 diastolic dysfunction; proximal septal thickening with no LVOT gradient. FINDINGS  Left Ventricle: Left ventricular ejection fraction, by visual estimation, is 60 to 65%. The left ventricle has normal function. The left ventricle has no regional wall motion abnormalities. There is no left ventricular hypertrophy. Left ventricular diastolic parameters are consistent with Grade I diastolic dysfunction (impaired relaxation). Normal left atrial pressure. Right Ventricle: The right ventricular size is normal.Global RV systolic function is has normal systolic function. Left Atrium: Left atrial size was normal in size. Right Atrium: Right atrial size was normal in size Pericardium: There is no evidence of pericardial effusion. Mitral Valve: The mitral valve is normal in structure. Trivial mitral valve regurgitation. No evidence of mitral valve stenosis by observation. Tricuspid Valve: The tricuspid valve is normal in structure. Tricuspid valve regurgitation is not demonstrated. Aortic Valve: The aortic valve has an indeterminant number of cusps. Aortic valve regurgitation is not visualized. The aortic valve is structurally normal, with no evidence of sclerosis or stenosis. Pulmonic Valve: The pulmonic valve was not well visualized. Pulmonic valve  regurgitation is not visualized. Pulmonic regurgitation is not visualized. Aorta: The aortic root is normal in size and structure. Venous: The inferior vena cava is normal in size with greater than 50% respiratory variability, suggesting right atrial pressure of 3 mmHg. IAS/Shunts: No atrial level shunt detected by color flow Doppler. Additional Comments: Normal LV systolic function; grade 1 diastolic dysfunction; proximal septal thickening with no LVOT gradient.  LEFT VENTRICLE PLAX 2D LVIDd:         4.50 cm Diastology LVIDs:         3.30 cm LV e' lateral:   7.51 cm/s LV PW:         0.90 cm LV E/e' lateral: 9.5 LV IVS:        0.90 cm LV e' medial:    6.64 cm/s LV SV:         48 ml   LV E/e' medial:  10.7 LV SV Index:   24.89  RIGHT VENTRICLE RV S prime:     12.50 cm/s TAPSE (M-mode): 2.3 cm LEFT ATRIUM             Index       RIGHT ATRIUM           Index LA diam:        3.20 cm 1.75 cm/m  RA Area:     11.50 cm LA Vol (A2C):   35.8 ml 19.58 ml/m RA Volume:   25.00 ml  13.68 ml/m LA Vol (A4C):   35.2 ml 19.26 ml/m LA Biplane Vol: 35.8 ml 19.58 ml/m  AORTIC VALVE LVOT Vmax:   95.30 cm/s LVOT Vmean:  65.800 cm/s LVOT VTI:    0.193 m  AORTA Ao Root diam: 3.00 cm MITRAL VALVE MV Area (PHT): 3.12 cm              SHUNTS MV PHT:        70.47 msec            Systemic VTI: 0.19 m MV Decel Time: 243 msec MV E velocity: 71.10 cm/s  103 cm/s MV A velocity:  108.00 cm/s 70.3 cm/s MV E/A ratio:  0.66        1.5  Olga Millers MD Electronically signed by Olga Millers MD Signature Date/Time: 10/31/2019/10:51:18 AM    Final    VAS US CAROTID (at [facility] and [facility] only)  Result Date: 10/31/2019 Carotid Arterial Duplex Study Indications: TIA. Limitations  Today's exam was limited due to sunlight. Performing Technologist: Levada Schilling RDMS, RVT  Examination Guidelines: A complete evaluation includes B-mode imaging, spectral Doppler, color Doppler, and power Doppler as needed of all accessible portions of each vessel. Bilateral  testing is considered an integral part of a complete examination. Limited examinations for reoccurring indications may be performed as noted.  Right Carotid Findings: +----------+-------+--------+--------+----------------------+------------------+           PSV    EDV cm/sStenosisPlaque Description    Comments                     cm/s                                                            +----------+-------+--------+--------+----------------------+------------------+ CCA Prox  96     19                                                       +----------+-------+--------+--------+----------------------+------------------+ CCA Distal76     16                                    intimal thickening +----------+-------+--------+--------+----------------------+------------------+ ICA Prox  94     22                                                       +----------+-------+--------+--------+----------------------+------------------+ ICA Mid   92     22                                                       +----------+-------+--------+--------+----------------------+------------------+ ICA Distal74     21                                                       +----------+-------+--------+--------+----------------------+------------------+ ECA       124    13              diffuse and  heterogenous                             +----------+-------+--------+--------+----------------------+------------------+ +----------+--------+-------+--------+-------------------+           PSV cm/sEDV cmsDescribeArm Pressure (mmHG) +----------+--------+-------+--------+-------------------+ [id number]                                        +----------+--------+-------+--------+-------------------+ +---------+--------+--+--------+--+---------+ VertebralPSV cm/s63EDV cm/s12Antegrade  +---------+--------+--+--------+--+---------+  Left Carotid Findings: +----------+--------+--------+--------+------------------------+--------+           PSV cm/sEDV cm/sStenosisPlaque Description      Comments +----------+--------+--------+--------+------------------------+--------+ CCA Prox  84      10                                               +----------+--------+--------+--------+------------------------+--------+ CCA Distal81      15              diffuse and heterogenous         +----------+--------+--------+--------+------------------------+--------+ ICA Prox  102     27                                               +----------+--------+--------+--------+------------------------+--------+ ICA Mid   87      17                                               +----------+--------+--------+--------+------------------------+--------+ ICA Distal80      25                                               +----------+--------+--------+--------+------------------------+--------+ ECA       112     12                                               +----------+--------+--------+--------+------------------------+--------+ +----------+--------+--------+--------+-------------------+           PSV cm/sEDV cm/sDescribeArm Pressure (mmHG) +----------+--------+--------+--------+-------------------+ [id number]                                         +----------+--------+--------+--------+-------------------+ +---------+--------+--+--------+--+---------+ VertebralPSV cm/s80EDV cm/s12Antegrade +---------+--------+--+--------+--+---------+  Summary: Right Carotid: Velocities in the right ICA are consistent with a 1-39% stenosis. Left Carotid: Velocities in the left ICA are consistent with a 1-39% stenosis. Vertebrals:  Bilateral vertebral arteries demonstrate antegrade flow. Subclavians: Normal flow hemodynamics were seen in bilateral subclavian               arteries. *See table(s) above for measurements and observations.     Preliminary     Impression/Plan   69 y.o. female  with acute right-sided periorbital headache, right-sided facial numbness and tingling and ataxia ("veering to  the right"). HA and facial N/T is for the most part resolved, although may be reoccurring this afternoon. Ataxia unchanged. She is neurologically intact on exam with exception of gait.  MRI brain reveales other left cerebellar infarcts. MRI C spine reveals multilevel degenerative changes most prominent at C5-6 and C6-7 where there is paracentral disc protrusions and osteophyte complexes resulting in mod central canal stenosis and severe b/l neural foraminal stenosis. T spine MRI normal.   I have reviewed the imaging with Dr. Conchita Paris. 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  We do not see an indication at this time for surgical decompression and fusion.    Please call for any questions.  Cindra Presume, PA-C [facility]

## 2019-10-31 NOTE — Progress Notes (Signed)
Rehab Admissions Coordinator Note:  Patient was screened by Clois Dupes for appropriateness for an Inpatient Acute Rehab Consult.  At this time, we are recommending OP. Feel patient lacks the medical neccesity for an in hospital acute rehab admit.  Clois Dupes 10/31/2019, 3:24 PM  I can be reached at [phone].

## 2019-11-01 NOTE — Progress Notes (Signed)
  Speech Language Pathology   Patient Details Name: Kristy Gill MRN: [id number] DOB: 1951/03/28 Today's Date: 11/01/2019 Time:  -        Screened. Pt without complaints in cognition or speech. No need for formal assessment.           GO                Royce Macadamia 11/01/2019, 9:50 AM  Breck Coons Lonell Face.Ed Nurse, children's [phone] Office [phone]

## 2019-11-01 NOTE — Progress Notes (Addendum)
[facility]  PROGRESS NOTE  Kristy Gill ZOX:[id number] DOB: Jul 05, 1951 DOA: 10/30/2019 PCP: Patient, [name] Per Admit date - 10/30/2019   Admitting Physician Laverna Peace, MD  Outpatient Primary MD for the patient is Patient, [name] Per  LOS - 1 Brief Narrative   Kristy Gill is a 69 y.o. year old female with medical history significant for migraines, type 2 diabetes, RA, HTN, hypothyroidism who presented on 10/30/2019 with acute onset right-sided weakness associated headache, facial numbness, and gait disorder with right-sided drift admitted for work-up of stroke and concern for potential spinal pathology.  Patient underwent recent orthopedic surgery (10/25/2018) of her right hand related to complications of her chronic RA; during the procedure she had a reaction to the nerve block which she reports this dyspnea and throat swelling while also receiving vancomycin infusion (reports history of hives to vancomycin in the past).  Patient states since being home it took a while to get back to her normal functioning which she thought was related to the sedation wearing off slowly.  She states day prior to admission in the morning time she noted pain in the center of her face with radiation near her right eye and associated headache.  She again went to sleep and upon waking up a few hours later around 10 AM she noted an inability to prevent herself from drifting to the right while sitting and standing and had to grab the wall to make it safely during ambulation at home.  Of note she was seen by physical therapy and reports recent manipulation of her neck on 1/4.    Hospital course Due to presenting symptoms patient underwent stroke work-up as recommended by neurology.  MRI brain was unremarkable.  However given history of cervical neck degenerative disease neurology recommended MRI thoracic/cervical spine.  Cervical spine imaging shows disc protrusion contacting C6 nerve root and elements of  moderate foraminal stenosis. Evaluated by neurosurgery, did not think symptoms were related to cervical stenosis given no weakness and presence of facial symptoms.   Subjective  No new weakness.  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  Discussed with neurosurgery who will assess to determine conservative management versus any potential need for neurosurgical intervention -Neurosurgery consulted, appreciate their recommendations --Outpatient NSG clinic follow up after discharge  Headache, facial numbness, rightward drift/ataxia, unclear etiology. Stroke ruled out. Atypical migraine? Headache better after Mag. Did not take VPA or reglan Facial numbness intermittent,  right upper drift persists, very unbalanced when working with PT on 1/10 and recommended SNF. Working with them today to see if any improvement  --outpatient neurology follow-up, with repeat MRI 4 weeks with thin cuts over the brainstem per neurology recommendations -Patient intolerant of aspirin,  -MRI brain negative for acute stroke -CT head/neck and vascular ultrasound imaging unremarkable  Hypertension, no home meds Slightly better but still not at goal --if persists may need to start HCTZ, allergies to lisinopril, amlodipine, -IV hydralazine as needed (SBP greater than 170, DBP greater than 100)  Hyperlipidemia LDL 157 -Patient intolerant of Zetia and statin  Chronic RA, stable Status post right hand surgery, 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 -Continue pain control tramadol,  Flexeril, tyelenol  Stable Chronic medical  problems Gout-allopurinol, 3 months of colchicine per rheumatology Hypothyroidism- synthroid           Family Communication  : No family at bedside  Code Status : Full code  Disposition Plan  : Inpatient status,still  significant risk for falls and injury related to imbalance from acute onset gait ataxia with rightward drift, patient requiring moderate assist for functional mobility, prior to this admission she lived with her son who works during the day had limited caregiver support, PT recommending postacute rehab at discharge.  Assessed by PT OT who both recommended CIR, if not labs recommends SNF due to risk mentioned.  Consults  : Neurosurgery, neurology  Procedures  : TTE, 1/10  DVT Prophylaxis  :  Lab Results  Component Value Date   PLT 235 10/30/2019    Diet :  Diet Order            Diet regular Room service appropriate? Yes; Fluid consistency: Thin  Diet effective now               Inpatient Medications Scheduled Meds: . allopurinol  100 mg Oral Q supper  . cholecalciferol  5,000 Units Oral QHS  . colchicine  0.6 mg Oral Daily  . enoxaparin (LOVENOX) injection  40 mg Subcutaneous Q24H  . levothyroxine  112 mcg Oral QAC breakfast  . metoCLOPramide (REGLAN) injection  10 mg Intravenous Once   Continuous Infusions: . valproate sodium     PRN Meds:.acetaminophen **OR** acetaminophen (TYLENOL) oral liquid 160 mg/5 mL **OR** acetaminophen, hydrALAZINE, menthol-cetylpyridinium, Muscle Rub, senna-docusate  Antibiotics  :   Anti-infectives (From admission, onward)   None       Objective   Vitals:   10/31/19 1948 10/31/19 2231 11/01/19 0600 11/01/19 0740  BP:  (!) 151/70  (!) 173/75  Pulse:  96  89  Resp: 19 18 16 18   Temp:  98.4 F (36.9 C)  98.3 F (36.8 C)  TempSrc:  Oral    SpO2:  98%  94%  Weight:      Height:        SpO2: 94 %  Wt Readings from Last 3 Encounters:  10/31/19 84 kg    No intake or output data in the 24 hours ending  11/01/19 1259  Physical Exam:  Awake Alert, Oriented X 3, Normal affect Lying in bed Has intact strength in bilateral lower extremities Has intact strength in left upper extremity For the most part has intact strength of right upper extremity, but limited due to recent orthopedic surgery Cast in place on right arm No facial droop, no dysphagia, normal speech [place].AT, Symmetrical Chest wall movement, Good air movement bilaterally, CTAB RRR,No Gallops,Rubs or new Murmurs,  +ve B.Sounds, Abd Soft, No tenderness, No rebound, guarding or rigidity. No edema   I have personally reviewed the following:   Data Reviewed:  CBC Recent Labs  Lab 10/30/19 1415 10/30/19 1421  WBC 9.1  --   HGB 14.6 15.3*  HCT 44.8 45.0  PLT 235  --   MCV 88.2  --   MCH 28.7  --   MCHC 32.6  --   RDW 12.8  --   LYMPHSABS 1.5  --   MONOABS 0.6  --   EOSABS 0.2  --   BASOSABS 0.1  --     Chemistries  Recent Labs  Lab 10/30/19 1415 10/30/19 1421  NA 135 136  K 3.7 3.9  CL 95* 98  CO2 27  --   GLUCOSE 139* 134*  BUN 12 14  CREATININE 0.72 0.70  CALCIUM 9.7  --   AST 21  --   ALT 26  --   ALKPHOS 75  --   BILITOT 0.8  --    ------------------------------------------------------------------------------------------------------------------ Recent Labs    10/31/19 0430  CHOL 239*  HDL 42  LDLCALC 157*  TRIG 199*  CHOLHDL 5.7    Lab Results  Component Value Date   HGBA1C 6.7 (H) 10/31/2019   ------------------------------------------------------------------------------------------------------------------ No results for input(s): TSH, T4TOTAL, T3FREE, THYROIDAB in the last 72 hours.  Invalid input(s): FREET3 ------------------------------------------------------------------------------------------------------------------ No results for input(s): VITAMINB12, FOLATE, FERRITIN, TIBC, IRON, RETICCTPCT in the last 72 hours.  Coagulation profile Recent Labs  Lab 10/30/19 1415  INR  1.0    No results for input(s): DDIMER in the last 72 hours.  Cardiac Enzymes No results for input(s): CKMB, TROPONINI, MYOGLOBIN in the last 168 hours.  Invalid input(s): CK ------------------------------------------------------------------------------------------------------------------ No results found for: BNP  Micro Results Recent Results (from the past 240 hour(s))  SARS CORONAVIRUS 2 (TAT 6-24 HRS) Nasopharyngeal Nasopharyngeal Swab     Status: None   Collection Time: 10/30/19  9:39 PM   Specimen: Nasopharyngeal Swab  Result Value Ref Range Status   SARS Coronavirus 2 NEGATIVE NEGATIVE Final    Comment: (NOTE) SARS-CoV-2 target nucleic acids are NOT DETECTED. The SARS-CoV-2 RNA is generally detectable in upper and lower respiratory specimens during the acute phase of infection. Negative results do not preclude SARS-CoV-2 infection, do not rule out co-infections with other pathogens, and should not be used as the sole basis for treatment or other patient management decisions. Negative results must be combined with clinical observations, patient history, and epidemiological information. The expected result is Negative. Fact Sheet for Patients: HairSlick.no Fact Sheet for Healthcare Providers: quierodirigir.com This test is not yet approved or cleared by the [place] FDA and  has been authorized for detection and/or diagnosis of SARS-CoV-2 by FDA under an Emergency Use Authorization (EUA). This EUA will remain  in effect (meaning this test can be used) for the duration of the COVID-19 declaration under Section 56 4(b)(1) of the Act, 21 U.S.C. section 360bbb-3(b)(1), unless the authorization is terminated or revoked sooner. Performed at [facility], [address] [place], [place] [address]     Radiology Reports CT Angio Head W or Wo Contrast  Result Date: 10/30/2019 CLINICAL DATA:  Right facial  numbness EXAM: CT ANGIOGRAPHY HEAD AND NECK TECHNIQUE: Multidetector CT imaging of the head and neck was performed using the standard protocol during bolus administration of intravenous contrast. Multiplanar CT image reconstructions and MIPs were obtained to evaluate the vascular anatomy. Carotid stenosis measurements (when applicable) are obtained utilizing NASCET criteria, using the distal internal carotid diameter as the denominator. CONTRAST:  18mL OMNIPAQUE IOHEXOL 350 MG/ML SOLN COMPARISON:  None. FINDINGS: CT HEAD FINDINGS Brain: There is no acute intracranial hemorrhage, mass effect, or edema. Gray-white differentiation is preserved. Patchy hypoattenuation in the supratentorial white matter is nonspecific but may reflect mild chronic microvascular ischemic changes. Ventricles and sulci are normal in size and configuration. Vascular: No hyperdense vessel. Intracranial atherosclerotic calcification is present at the skull base. Skull: Unremarkable.  Likely chronic nasal bone irregularity. Sinuses: Aerated. Orbits: Unremarkable. Review of the MIP images confirms the above findings CTA NECK FINDINGS Aortic arch: Great vessel origins are patent. Right carotid system: Patent. Minimal plaque is present at the common carotid bifurcation and proximal ICA  without measurable stenosis. Left carotid system: Patent. Minimal plaque is present at the bifurcation and proximal ICA without measurable stenosis. Vertebral arteries: Patent and codominant. Skeleton: Multilevel degenerative changes of the cervical spine. Other neck: No neck mass or adenopathy. Upper chest: No apical lung mass Review of the MIP images confirms the above findings CTA HEAD FINDINGS Anterior circulation: Intracranial internal carotid arteries are patent with calcified plaque causing mild stenosis. Anterior and middle cerebral arteries are patent. Posterior circulation: Intracranial vertebral arteries are patent with mild calcified plaque. Basilar  artery is patent. Posterior cerebral arteries are patent. Venous sinuses: As permitted by contrast timing, patent. Review of the MIP images confirms the above findings IMPRESSION: No acute intracranial hemorrhage or evidence of acute infarction. Probable mild chronic microvascular ischemic changes. No large vessel occlusion or hemodynamically significant stenosis. Electronically Signed   By: Macy Mis M.D.   On: 10/30/2019 16:19   CT Angio Neck W and/or Wo Contrast  Result Date: 10/30/2019 CLINICAL DATA:  Right facial numbness EXAM: CT ANGIOGRAPHY HEAD AND NECK TECHNIQUE: Multidetector CT imaging of the head and neck was performed using the standard protocol during bolus administration of intravenous contrast. Multiplanar CT image reconstructions and MIPs were obtained to evaluate the vascular anatomy. Carotid stenosis measurements (when applicable) are obtained utilizing NASCET criteria, using the distal internal carotid diameter as the denominator. CONTRAST:  64mL OMNIPAQUE IOHEXOL 350 MG/ML SOLN COMPARISON:  None. FINDINGS: CT HEAD FINDINGS Brain: There is no acute intracranial hemorrhage, mass effect, or edema. Gray-white differentiation is preserved. Patchy hypoattenuation in the supratentorial white matter is nonspecific but may reflect mild chronic microvascular ischemic changes. Ventricles and sulci are normal in size and configuration. Vascular: No hyperdense vessel. Intracranial atherosclerotic calcification is present at the skull base. Skull: Unremarkable.  Likely chronic nasal bone irregularity. Sinuses: Aerated. Orbits: Unremarkable. Review of the MIP images confirms the above findings CTA NECK FINDINGS Aortic arch: Great vessel origins are patent. Right carotid system: Patent. Minimal plaque is present at the common carotid bifurcation and proximal ICA without measurable stenosis. Left carotid system: Patent. Minimal plaque is present at the bifurcation and proximal ICA without measurable  stenosis. Vertebral arteries: Patent and codominant. Skeleton: Multilevel degenerative changes of the cervical spine. Other neck: No neck mass or adenopathy. Upper chest: No apical lung mass Review of the MIP images confirms the above findings CTA HEAD FINDINGS Anterior circulation: Intracranial internal carotid arteries are patent with calcified plaque causing mild stenosis. Anterior and middle cerebral arteries are patent. Posterior circulation: Intracranial vertebral arteries are patent with mild calcified plaque. Basilar artery is patent. Posterior cerebral arteries are patent. Venous sinuses: As permitted by contrast timing, patent. Review of the MIP images confirms the above findings IMPRESSION: No acute intracranial hemorrhage or evidence of acute infarction. Probable mild chronic microvascular ischemic changes. No large vessel occlusion or hemodynamically significant stenosis. Electronically Signed   By: Macy Mis M.D.   On: 10/30/2019 16:19   MR BRAIN WO CONTRAST  Result Date: 10/30/2019 CLINICAL DATA:  Right facial numbness EXAM: MRI HEAD WITHOUT CONTRAST TECHNIQUE: Multiplanar, multiecho pulse sequences of the brain and surrounding structures were obtained without intravenous contrast. COMPARISON:  Correlation made with same day CTA FINDINGS: Brain: There is no acute infarction or intracranial hemorrhage. There is no intracranial mass, mass effect, or edema. There is no hydrocephalus or extra-axial fluid collection. Patchy foci of T2 hyperintensity in the supratentorial white matter are nonspecific but may reflect mild chronic microvascular ischemic changes. Small chronic left cerebellar  infarcts are noted. Vascular: Major vessel flow voids at the skull base are preserved. Skull and upper cervical spine: Normal marrow signal is preserved. Sinuses/Orbits: Paranasal sinuses are aerated. Orbits are unremarkable. Other: Sella is unremarkable.  Mastoid air cells are clear. IMPRESSION: No acute  infarction, intracranial hemorrhage, or mass. Mild chronic microvascular ischemic changes. Small chronic left cerebellar infarcts. Electronically Signed   By: Guadlupe Spanish M.D.   On: 10/30/2019 19:22   MR CERVICAL SPINE WO CONTRAST  Result Date: 10/31/2019 CLINICAL DATA:  Retro-orbital headache EXAM: MRI CERVICAL SPINE WITHOUT CONTRAST TECHNIQUE: Multiplanar, multisequence MR imaging of the cervical spine was performed. No intravenous contrast was administered. COMPARISON:  None. FINDINGS: Alignment: There is straightening of the normal cervical lordosis. There is a minimal anterolisthesis of C3 on C4 measuring 2 mm. Vertebrae: The vertebral body heights are well maintained. No fracture, marrow edema,or pathologic marrow infiltration. Fatty endplate changes with anterior osteophytes seen at C6-C7. Cord: Normal signal and morphology. Posterior Fossa, vertebral arteries, paraspinal tissues: The visualized portion of the posterior fossa is unremarkable. Normal flow voids seen within the vertebral arteries. The paraspinal soft tissues are unremarkable. Disc levels: C1-C2: Atlanto-axial junction is normal, without canal narrowing C2-C3: Disc osteophyte complex is seen which causes moderate left neural foraminal narrowing. C3-C4: Disc osteophyte complex and uncovertebral osteophytes are seen which causes severe left and mild right neural foraminal narrowing. C4-C5: Disc osteophyte complex and uncovertebral osteophytes are seen which causes mild bilateral neural foraminal narrowing. C5-C6: Disc osteophyte complex with a right paracentral disc protrusion is seen which contacts the exiting right C6 nerve root. There is severe bilateral neural foraminal narrowing. The central thecal sac is effaced measuring 5 mm in AP dimension. C6-C7: Disc osteophyte complex which is asymmetric to the right with uncovertebral osteophytes are seen which causes severe bilateral neural foraminal narrowing. The central thecal sac is  effaced measuring 6 mm in AP diameter. C7-T1: Minimal disc osteophyte complex is seen which causes mild bilateral neural foraminal narrowing. 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 Also at C6-C7 there is severe bilateral neural foraminal narrowing and moderate central canal stenosis. Electronically Signed   By: Jonna Clark M.D.   On: 10/31/2019 03:50   MR THORACIC SPINE WO CONTRAST  Result Date: 10/31/2019 CLINICAL DATA:  Headaches and gait disorder EXAM: MRI THORACIC SPINE WITHOUT CONTRAST TECHNIQUE: Multiplanar, multisequence MR imaging of the thoracic spine was performed. No intravenous contrast was administered. COMPARISON:  None. FINDINGS: Alignment:  Physiologic. Vertebrae: No fracture, evidence of discitis, or bone lesion. Cord:  Normal signal and morphology. Paraspinal and other soft tissues: Negative. Disc levels: No disc herniation or stenosis. IMPRESSION: Normal thoracic spine MRI. Electronically Signed   By: Deatra Robinson M.D.   On: 10/31/2019 04:05   ECHOCARDIOGRAM COMPLETE  Result Date: 10/31/2019   ECHOCARDIOGRAM REPORT   Patient Name:   SAHITHI ORDOYNE Date of Exam: 10/31/2019 Medical Rec #:  [id number]    Height:       61.0 in Accession #:    [id number]   Weight:       185.2 lb Date of Birth:  02-Mar-1951    BSA:          1.83 m Patient Age:    68 years     BP:           183/97 mmHg Patient  Gender: F            HR:           89 bpm. Exam Location:  Inpatient Procedure: 2D Echo Indications:    TIA 435.9  History:        Patient has no prior history of Echocardiogram examinations.                 Signs/Symptoms:history of murmur; Risk Factors:Hypertension.  Sonographer:    Delcie Roch Referring Phys: [id number] ARAVIND CHANDRA IMPRESSIONS  1. Left ventricular ejection fraction, by visual estimation, is 60  to 65%. The left ventricle has normal function. There is no left ventricular hypertrophy.  2. Left ventricular diastolic parameters are consistent with Grade I diastolic dysfunction (impaired relaxation).  3. The left ventricle has no regional wall motion abnormalities.  4. Global right ventricle has normal systolic function.The right ventricular size is normal.  5. Left atrial size was normal.  6. Right atrial size was normal.  7. The mitral valve is normal in structure. Trivial mitral valve regurgitation. No evidence of mitral stenosis.  8. The tricuspid valve is normal in structure.  9. The aortic valve has an indeterminant number of cusps. Aortic valve regurgitation is not visualized. No evidence of aortic valve sclerosis or stenosis. 10. The pulmonic valve was not well visualized. Pulmonic valve regurgitation is not visualized. 11. The inferior vena cava is normal in size with greater than 50% respiratory variability, suggesting right atrial pressure of 3 mmHg. 12. Normal LV systolic function; grade 1 diastolic dysfunction; proximal septal thickening with no LVOT gradient. FINDINGS  Left Ventricle: Left ventricular ejection fraction, by visual estimation, is 60 to 65%. The left ventricle has normal function. The left ventricle has no regional wall motion abnormalities. There is no left ventricular hypertrophy. Left ventricular diastolic parameters are consistent with Grade I diastolic dysfunction (impaired relaxation). Normal left atrial pressure. Right Ventricle: The right ventricular size is normal.Global RV systolic function is has normal systolic function. Left Atrium: Left atrial size was normal in size. Right Atrium: Right atrial size was normal in size Pericardium: There is no evidence of pericardial effusion. Mitral Valve: The mitral valve is normal in structure. Trivial mitral valve regurgitation. No evidence of mitral valve stenosis by observation. Tricuspid Valve: The tricuspid valve is normal in  structure. Tricuspid valve regurgitation is not demonstrated. Aortic Valve: The aortic valve has an indeterminant number of cusps. Aortic valve regurgitation is not visualized. The aortic valve is structurally normal, with no evidence of sclerosis or stenosis. Pulmonic Valve: The pulmonic valve was not well visualized. Pulmonic valve regurgitation is not visualized. Pulmonic regurgitation is not visualized. Aorta: The aortic root is normal in size and structure. Venous: The inferior vena cava is normal in size with greater than 50% respiratory variability, suggesting right atrial pressure of 3 mmHg. IAS/Shunts: No atrial level shunt detected by color flow Doppler. Additional Comments: Normal LV systolic function; grade 1 diastolic dysfunction; proximal septal thickening with no LVOT gradient.  LEFT VENTRICLE PLAX 2D LVIDd:         4.50 cm Diastology LVIDs:         3.30 cm LV e' lateral:   7.51 cm/s LV PW:         0.90 cm LV E/e' lateral: 9.5 LV IVS:        0.90 cm LV e' medial:    6.64 cm/s LV SV:         48 ml   LV  E/e' medial:  10.7 LV SV Index:   24.89  RIGHT VENTRICLE RV S prime:     12.50 cm/s TAPSE (M-mode): 2.3 cm LEFT ATRIUM             Index       RIGHT ATRIUM           Index LA diam:        3.20 cm 1.75 cm/m  RA Area:     11.50 cm LA Vol (A2C):   35.8 ml 19.58 ml/m RA Volume:   25.00 ml  13.68 ml/m LA Vol (A4C):   35.2 ml 19.26 ml/m LA Biplane Vol: 35.8 ml 19.58 ml/m  AORTIC VALVE LVOT Vmax:   95.30 cm/s LVOT Vmean:  65.800 cm/s LVOT VTI:    0.193 m  AORTA Ao Root diam: 3.00 cm MITRAL VALVE MV Area (PHT): 3.12 cm              SHUNTS MV PHT:        70.47 msec            Systemic VTI: 0.19 m MV Decel Time: 243 msec MV E velocity: 71.10 cm/s  103 cm/s MV A velocity: 108.00 cm/s 70.3 cm/s MV E/A ratio:  0.66        1.5  Olga Millers MD Electronically signed by Olga Millers MD Signature Date/Time: 10/31/2019/10:51:18 AM    Final    VAS US CAROTID (at [facility] and [facility] only)  Result Date:  10/31/2019 Carotid Arterial Duplex Study Indications: TIA. Limitations  Today's exam was limited due to sunlight. Performing Technologist: Levada Schilling RDMS, RVT  Examination Guidelines: A complete evaluation includes B-mode imaging, spectral Doppler, color Doppler, and power Doppler as needed of all accessible portions of each vessel. Bilateral testing is considered an integral part of a complete examination. Limited examinations for reoccurring indications may be performed as noted.  Right Carotid Findings: +----------+-------+--------+--------+----------------------+------------------+           PSV    EDV cm/sStenosisPlaque Description    Comments                     cm/s                                                            +----------+-------+--------+--------+----------------------+------------------+ CCA Prox  96     19                                                       +----------+-------+--------+--------+----------------------+------------------+ CCA Distal76     16                                    intimal thickening +----------+-------+--------+--------+----------------------+------------------+ ICA Prox  94     22                                                       +----------+-------+--------+--------+----------------------+------------------+  ICA Mid   92     22                                                       +----------+-------+--------+--------+----------------------+------------------+ ICA Distal74     21                                                       +----------+-------+--------+--------+----------------------+------------------+ ECA       124    13              diffuse and                                                               heterogenous                             +----------+-------+--------+--------+----------------------+------------------+  +----------+--------+-------+--------+-------------------+           PSV cm/sEDV cmsDescribeArm Pressure (mmHG) +----------+--------+-------+--------+-------------------+ [id number]                                        +----------+--------+-------+--------+-------------------+ +---------+--------+--+--------+--+---------+ VertebralPSV cm/s63EDV cm/s12Antegrade +---------+--------+--+--------+--+---------+  Left Carotid Findings: +----------+--------+--------+--------+------------------------+--------+           PSV cm/sEDV cm/sStenosisPlaque Description      Comments +----------+--------+--------+--------+------------------------+--------+ CCA Prox  84      10                                               +----------+--------+--------+--------+------------------------+--------+ CCA Distal81      15              diffuse and heterogenous         +----------+--------+--------+--------+------------------------+--------+ ICA Prox  102     27                                               +----------+--------+--------+--------+------------------------+--------+ ICA Mid   87      17                                               +----------+--------+--------+--------+------------------------+--------+ ICA Distal80      25                                               +----------+--------+--------+--------+------------------------+--------+ ECA  112     12                                               +----------+--------+--------+--------+------------------------+--------+ +----------+--------+--------+--------+-------------------+           PSV cm/sEDV cm/sDescribeArm Pressure (mmHG) +----------+--------+--------+--------+-------------------+ [id number]                                         +----------+--------+--------+--------+-------------------+ +---------+--------+--+--------+--+---------+ VertebralPSV cm/s80EDV  cm/s12Antegrade +---------+--------+--+--------+--+---------+  Summary: Right Carotid: Velocities in the right ICA are consistent with a 1-39% stenosis. Left Carotid: Velocities in the left ICA are consistent with a 1-39% stenosis. Vertebrals:  Bilateral vertebral arteries demonstrate antegrade flow. Subclavians: Normal flow hemodynamics were seen in bilateral subclavian              arteries. *See table(s) above for measurements and observations.     Preliminary      Time Spent in minutes  30     Laverna Peace M.D on 11/01/2019 at 12:59 PM  To page go to [email or web address] - password [facility]

## 2019-11-01 NOTE — Care Management (Signed)
Disposition planning: Patient not candidate for CIR, per notes. Patient states she has [employer] and coverage through the [place]. She goes to the [facility] at the [place], her PCP starts with "P" but she can't remember the name.  She has a rollator at home. Her sister is able to stay with her for a few days when she goes home.

## 2019-11-01 NOTE — Progress Notes (Signed)
Physical Therapy Treatment Patient Details Name: Kristy Gill MRN: [id number] DOB: 14-Oct-1951 Today's Date: 11/01/2019    History of Present Illness Kristy Gill is a 69 y.o. year old female with medical history significant for migraines, type 2 diabetes, RA, HTN, hypothyroidism who presented on 10/30/2019 with acute onset right-sided weakness associated headache, facial numbness, and gait disorder with right-sided drift admitted for work-up of stroke and concern for potential spinal pathology, bur ruled out by neurosurgery.  MRI negative, but chronic small cerebellar infarcts. R UE casted due to recent wrist surgery.    PT Comments    Patient progressing with mobility able to ambulate with one assist with mod support for balance.  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  Feel she continues to be appropriate for CIR level rehab prior to d/c home.  PT to follow acutely.    Follow Up Recommendations  CIR     Equipment Recommendations  None recommended by PT    Recommendations for Other Services       Precautions / Restrictions Precautions Precautions: Fall Required Braces or Orthoses: Splint/Cast Splint/Cast: Rt wrist     Mobility  Bed Mobility Overal bed mobility: Needs Assistance       Supine to sit: Supervision     General bed mobility comments: S for safety  Transfers Overall transfer level: Needs assistance Equipment used: 1 person hand held assist Transfers: Sit to/from Stand Sit to Stand: Min assist         General transfer comment: assist for balance, demonstrates decreased L lateral weight shift  Ambulation/Gait Ambulation/Gait assistance: Mod assist Gait Distance (Feet): 100 Feet(x 2, standing rest break at counter with R knee pain) Assistive device: 1 person hand held assist Gait Pattern/deviations:  Step-to pattern;Step-through pattern;Ataxic;Staggering right;Decreased stride length;Antalgic     General Gait Details: leaning to R and occasional R knee buckling, assist with L HHA, and therapists R hand around R side ribs keeping from R Lateral LOB   Stairs             Wheelchair Mobility    Modified Rankin (Stroke Patients Only) Modified Rankin (Stroke Patients Only) Pre-Morbid Rankin Score: No symptoms Modified Rankin: Moderately severe disability     Balance Overall balance assessment: Needs assistance Sitting-balance support: Feet supported Sitting balance-Leahy Scale: Good     Standing balance support: Single extremity supported Standing balance-Leahy Scale: Poor Standing balance comment: UE support for balance               High Level Balance Comments: side stepping at counter/sink in room with UE support and min A around hips with frequent R knee buckling            Cognition Arousal/Alertness: Awake/alert Behavior During Therapy: WFL for tasks assessed/performed Overall Cognitive Status: Within Functional Limits for tasks assessed                                        Exercises      General Comments General comments (skin integrity, edema, etc.): reiterates that her son works during the day; toileted in bathroom with assist for transfers      Pertinent Vitals/Pain Pain Assessment: No/denies pain    Home Living  Prior Function            PT Goals (current goals can now be found in the care plan section) Progress towards PT goals: Progressing toward goals    Frequency    Min 4X/week      PT Plan      Co-evaluation              AM-PAC PT "6 Clicks" Mobility   Outcome Measure  Help needed turning from your back to your side while in a flat bed without using bedrails?: None Help needed moving from lying on your back to sitting on the side of a flat bed without using  bedrails?: None Help needed moving to and from a bed to a chair (including a wheelchair)?: A Little Help needed standing up from a chair using your arms (e.g., wheelchair or bedside chair)?: A Lot Help needed to walk in hospital room?: A Lot Help needed climbing 3-5 steps with a railing? : A Lot 6 Click Score: 17    End of Session   Activity Tolerance: Patient tolerated treatment well Patient left: in chair;with call bell/phone within reach   PT Visit Diagnosis: Other abnormalities of gait and mobility (R26.89);Other symptoms and signs involving the nervous system (R29.898);Ataxic gait (R26.0)     Time: 1030-1106 PT Time Calculation (min) (ACUTE ONLY): 36 min  Charges:  $Gait Training: 8-22 mins $Neuromuscular Re-education: 8-22 mins                     Magda Kiel, [place] [facility] [phone] 11/01/2019    Kristy Gill 11/01/2019, 1:41 PM

## 2019-11-02 DIAGNOSIS — R200 Anesthesia of skin: Secondary | ICD-10-CM

## 2019-11-02 DIAGNOSIS — M069 Rheumatoid arthritis, unspecified: Secondary | ICD-10-CM

## 2019-11-02 LAB — BASIC METABOLIC PANEL
Anion gap: 13 (ref 5–15)
BUN: 19 mg/dL (ref 8–23)
CO2: 24 mmol/L (ref 22–32)
Calcium: 9.7 mg/dL (ref 8.9–10.3)
Chloride: 100 mmol/L (ref 98–111)
Creatinine, Ser: 0.9 mg/dL (ref 0.44–1.00)
GFR calc Af Amer: >60 mL/min (ref >60)
GFR calc non Af Amer: >60 mL/min (ref >60)
Glucose, Bld: 142 mg/dL — ABNORMAL HIGH (ref 70–99)
Potassium: 3.8 mmol/L (ref 3.5–5.1)
Sodium: 137 mmol/L (ref 135–145)

## 2019-11-02 NOTE — Progress Notes (Signed)
[facility]  PROGRESS NOTE  Kristy Gill ZHY:[id number] DOB: December 08, 1950 DOA: 10/30/2019 PCP: Patient, [name] Per Admit date - 10/30/2019   Admitting Physician Desiree Hane, MD  Outpatient Primary MD for the patient is Patient, [name] Per  LOS - 2 Brief Narrative   Kristy Gill is a 69 y.o. year old female with medical history significant for migraines, type 2 diabetes, RA, HTN, hypothyroidism who presented on 10/30/2019 with acute onset right-sided weakness associated headache, facial numbness, and gait disorder with right-sided drift admitted for work-up of stroke and concern for potential spinal pathology.  Patient underwent recent orthopedic surgery (10/25/2018) of her right hand related to complications of her chronic RA; during the procedure she had a reaction to the nerve block which she reports this dyspnea and throat swelling while also receiving vancomycin infusion (reports history of hives to vancomycin in the past).  Patient states since being home it took a while to get back to her normal functioning which she thought was related to the sedation wearing off slowly.  She states day prior to admission in the morning time she noted pain in the center of her face with radiation near her right eye and associated headache.  She again went to sleep and upon waking up a few hours later around 10 AM she noted an inability to prevent herself from drifting to the right while sitting and standing and had to grab the wall to make it safely during ambulation at home.  Of note she was seen by physical therapy and reports recent manipulation of her neck on 1/4.    Hospital course Due to presenting symptoms patient underwent stroke work-up as recommended by neurology.  MRI brain was unremarkable.  However given history of cervical neck degenerative disease neurology recommended MRI thoracic/cervical spine.  Cervical spine imaging shows disc protrusion contacting C6 nerve root and elements of  moderate foraminal stenosis. Evaluated by neurosurgery, did not think symptoms were related to cervical stenosis given no weakness and presence of facial symptoms.   Subjective  No new weakness.  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 Atypical migraine not very likely though headache better with Mg.  Facial numbness intermittent, and Rightward drift has improved with PT/OT work here Not a CIR candidate -need [facility] -patient states family friends/family can supplement HH to create 24 H assistance -safe dc on 1/13 pending [facility] approval of therapy -Prednisone could help with neuritis but patient has allergies and prefers supportive care  --outpatient neurology follow-up, with repeat MRI 4 weeks with thin cuts over the brainstem per neurology recommendations -Patient intolerant of aspirin,   Hypertension, no home meds Slightly better but still not at goal --if persists may need to start HCTZ, allergies to lisinopril, amlodipine, -IV hydralazine as needed (SBP greater than 170, DBP greater than 100)  Hyperlipidemia LDL 157 -Patient intolerant of Zetia and statin  Chronic RA, stable Status post right hand surgery, related to  joint disease from RA Had nerve blockade with some side effects on day of surgery -Continue pain control tramadol, Flexeril, tyelenol  Stable Chronic  medical problems Gout-allopurinol, 3 months of colchicine per rheumatology Hypothyroidism- synthroid      Family Communication  : No family at bedside  Code Status : Full code  Disposition Plan  : likely dc 1/13 if [facility] therapy. Family/family friends should be able to provide 24 H assistance by 1/13. Otherwise medically cleared  Consults  : Neurosurgery, neurology  Procedures  : TTE, 1/10  DVT Prophylaxis  :  Lab Results  Component Value Date   PLT 235 10/30/2019    Diet :  Diet Order            Diet regular Room service appropriate? Yes; Fluid consistency: Thin  Diet effective now               Inpatient Medications Scheduled Meds:  allopurinol  100 mg Oral Q supper   cholecalciferol  5,000 Units Oral QHS   colchicine  0.6 mg Oral Daily   enoxaparin (LOVENOX) injection  40 mg Subcutaneous Q24H   levothyroxine  112 mcg Oral QAC breakfast   metoCLOPramide (REGLAN) injection  10 mg Intravenous Once   Continuous Infusions:  PRN Meds:.acetaminophen **OR** acetaminophen (TYLENOL) oral liquid 160 mg/5 mL **OR** acetaminophen, hydrALAZINE, menthol-cetylpyridinium, Muscle Rub, senna-docusate  Antibiotics  :   Anti-infectives (From admission, onward)   None       Objective   Vitals:   11/01/19 0740 11/01/19 1627 11/01/19 2336 11/02/19 0832  BP: (!) 173/75 (!) 146/60 (!) 128/51 (!) 145/61  Pulse: 89 97 84 81  Resp: 18 19 18 12   Temp: 98.3 F (36.8 C) 98 F (36.7 C) 98.5 F (36.9 C) 98.2 F (36.8 C)  TempSrc:  Oral Oral Oral  SpO2: 94% 97% 95% 95%  Weight:      Height:        SpO2: 95 %  Wt Readings from Last 3 Encounters:  10/31/19 84 kg    No intake or output data in the 24 hours ending 11/02/19 1644  Physical Exam:  Awake Alert, Oriented X 3, Normal affect Lying in bed Has intact strength in bilateral lower extremities Has intact strength in left upper extremity For the most part has intact strength of right  upper extremity, but limited due to recent orthopedic surgery Cast in place on right arm No facial droop, no dysphagia, normal speech [place].AT, Symmetrical Chest wall movement, Good air movement bilaterally, CTAB RRR,No Gallops,Rubs or new Murmurs,  +ve B.Sounds, Abd Soft, No tenderness, No rebound, guarding or rigidity. No edema   I have personally reviewed the following:   Data Reviewed:  CBC Recent Labs  Lab 10/30/19 1415 10/30/19 1421  WBC 9.1  --   HGB 14.6 15.3*  HCT 44.8 45.0  PLT 235  --   MCV 88.2  --   MCH 28.7  --   MCHC 32.6  --   RDW 12.8  --   LYMPHSABS 1.5  --   MONOABS 0.6  --   EOSABS 0.2  --   BASOSABS 0.1  --     Chemistries  Recent Labs  Lab 10/30/19 1415 10/30/19 1421 11/02/19 0208  NA 135 136 137  K 3.7 3.9 3.8  CL 95* 98 100  CO2 27  --  24  GLUCOSE 139* 134* 142*  BUN 12 14 19   CREATININE 0.72 0.70 0.90  CALCIUM 9.7  --  9.7  AST 21  --   --   ALT 26  --   --   ALKPHOS 75  --   --   BILITOT 0.8  --   --    ------------------------------------------------------------------------------------------------------------------ Recent Labs    10/31/19 0430  CHOL 239*  HDL 42  LDLCALC 157*  TRIG 199*  CHOLHDL 5.7    Lab Results  Component Value Date   HGBA1C 6.7 (H) 10/31/2019   ------------------------------------------------------------------------------------------------------------------ No results for input(s): TSH, T4TOTAL, T3FREE, THYROIDAB in the last 72 hours.  Invalid input(s): FREET3 ------------------------------------------------------------------------------------------------------------------ No results for input(s): VITAMINB12, FOLATE, FERRITIN, TIBC, IRON, RETICCTPCT in the last 72 hours.  Coagulation profile Recent Labs  Lab 10/30/19 1415  INR 1.0    No results for input(s): DDIMER in the last 72 hours.  Cardiac Enzymes No results for input(s): CKMB, TROPONINI, MYOGLOBIN in the last 168 hours.  Invalid  input(s): CK ------------------------------------------------------------------------------------------------------------------ No results found for: BNP  Micro Results Recent Results (from the past 240 hour(s))  SARS CORONAVIRUS 2 (TAT 6-24 HRS) Nasopharyngeal Nasopharyngeal Swab     Status: None   Collection Time: 10/30/19  9:39 PM   Specimen: Nasopharyngeal Swab  Result Value Ref Range Status   SARS Coronavirus 2 NEGATIVE NEGATIVE Final    Comment: (NOTE) SARS-CoV-2 target nucleic acids are NOT DETECTED. The SARS-CoV-2 RNA is generally detectable in upper and lower respiratory specimens during the acute phase of infection. Negative results do not preclude SARS-CoV-2 infection, do not rule out co-infections with other pathogens, and should not be used as the sole basis for treatment or other patient management decisions. Negative results must be combined with clinical observations, patient history, and epidemiological information. The expected result is Negative. Fact Sheet for Patients: HairSlick.no Fact Sheet for Healthcare Providers: quierodirigir.com This test is not yet approved or cleared by the [place] FDA and  has been authorized for detection and/or diagnosis of SARS-CoV-2 by FDA under an Emergency Use Authorization (EUA). This EUA will remain  in effect (meaning this test can be used) for the duration of the COVID-19 declaration under Section 56 4(b)(1) of the Act, 21 U.S.C. section 360bbb-3(b)(1), unless the authorization is terminated or revoked sooner. Performed at [facility], [address] [place], [place] [address]     Radiology Reports CT Angio Head W or Wo Contrast  Result Date: 10/30/2019 CLINICAL DATA:  Right facial numbness EXAM: CT ANGIOGRAPHY HEAD AND NECK TECHNIQUE: Multidetector CT imaging of the head and neck was performed using the standard protocol during bolus administration of  intravenous contrast. Multiplanar CT image reconstructions and MIPs were obtained to evaluate the vascular anatomy. Carotid stenosis measurements (when applicable) are obtained utilizing NASCET criteria, using the distal internal carotid diameter as the denominator. CONTRAST:  75mL OMNIPAQUE IOHEXOL 350 MG/ML SOLN COMPARISON:  None. FINDINGS: CT HEAD FINDINGS Brain: There is no acute intracranial hemorrhage, mass effect, or edema. Gray-white differentiation is preserved. Patchy hypoattenuation in the supratentorial white matter is nonspecific but may reflect mild chronic microvascular ischemic changes. Ventricles and sulci are normal in size and configuration. Vascular: No hyperdense vessel. Intracranial atherosclerotic calcification is present at the skull base. Skull: Unremarkable.  Likely chronic nasal bone irregularity. Sinuses: Aerated. Orbits: Unremarkable. Review of the MIP images confirms the above findings CTA NECK FINDINGS Aortic arch: Great vessel origins are patent. Right carotid system: Patent. Minimal plaque is present at the common carotid bifurcation and proximal ICA without measurable stenosis. Left carotid  system: Patent. Minimal plaque is present at the bifurcation and proximal ICA without measurable stenosis. Vertebral arteries: Patent and codominant. Skeleton: Multilevel degenerative changes of the cervical spine. Other neck: No neck mass or adenopathy. Upper chest: No apical lung mass Review of the MIP images confirms the above findings CTA HEAD FINDINGS Anterior circulation: Intracranial internal carotid arteries are patent with calcified plaque causing mild stenosis. Anterior and middle cerebral arteries are patent. Posterior circulation: Intracranial vertebral arteries are patent with mild calcified plaque. Basilar artery is patent. Posterior cerebral arteries are patent. Venous sinuses: As permitted by contrast timing, patent. Review of the MIP images confirms the above findings IMPRESSION:  No acute intracranial hemorrhage or evidence of acute infarction. Probable mild chronic microvascular ischemic changes. No large vessel occlusion or hemodynamically significant stenosis. Electronically Signed   By: Guadlupe Spanish M.D.   On: 10/30/2019 16:19   CT Angio Neck W and/or Wo Contrast  Result Date: 10/30/2019 CLINICAL DATA:  Right facial numbness EXAM: CT ANGIOGRAPHY HEAD AND NECK TECHNIQUE: Multidetector CT imaging of the head and neck was performed using the standard protocol during bolus administration of intravenous contrast. Multiplanar CT image reconstructions and MIPs were obtained to evaluate the vascular anatomy. Carotid stenosis measurements (when applicable) are obtained utilizing NASCET criteria, using the distal internal carotid diameter as the denominator. CONTRAST:  75mL OMNIPAQUE IOHEXOL 350 MG/ML SOLN COMPARISON:  None. FINDINGS: CT HEAD FINDINGS Brain: There is no acute intracranial hemorrhage, mass effect, or edema. Gray-white differentiation is preserved. Patchy hypoattenuation in the supratentorial white matter is nonspecific but may reflect mild chronic microvascular ischemic changes. Ventricles and sulci are normal in size and configuration. Vascular: No hyperdense vessel. Intracranial atherosclerotic calcification is present at the skull base. Skull: Unremarkable.  Likely chronic nasal bone irregularity. Sinuses: Aerated. Orbits: Unremarkable. Review of the MIP images confirms the above findings CTA NECK FINDINGS Aortic arch: Great vessel origins are patent. Right carotid system: Patent. Minimal plaque is present at the common carotid bifurcation and proximal ICA without measurable stenosis. Left carotid system: Patent. Minimal plaque is present at the bifurcation and proximal ICA without measurable stenosis. Vertebral arteries: Patent and codominant. Skeleton: Multilevel degenerative changes of the cervical spine. Other neck: No neck mass or adenopathy. Upper chest: No apical  lung mass Review of the MIP images confirms the above findings CTA HEAD FINDINGS Anterior circulation: Intracranial internal carotid arteries are patent with calcified plaque causing mild stenosis. Anterior and middle cerebral arteries are patent. Posterior circulation: Intracranial vertebral arteries are patent with mild calcified plaque. Basilar artery is patent. Posterior cerebral arteries are patent. Venous sinuses: As permitted by contrast timing, patent. Review of the MIP images confirms the above findings IMPRESSION: No acute intracranial hemorrhage or evidence of acute infarction. Probable mild chronic microvascular ischemic changes. No large vessel occlusion or hemodynamically significant stenosis. Electronically Signed   By: Guadlupe Spanish M.D.   On: 10/30/2019 16:19   MR BRAIN WO CONTRAST  Result Date: 10/30/2019 CLINICAL DATA:  Right facial numbness EXAM: MRI HEAD WITHOUT CONTRAST TECHNIQUE: Multiplanar, multiecho pulse sequences of the brain and surrounding structures were obtained without intravenous contrast. COMPARISON:  Correlation made with same day CTA FINDINGS: Brain: There is no acute infarction or intracranial hemorrhage. There is no intracranial mass, mass effect, or edema. There is no hydrocephalus or extra-axial fluid collection. Patchy foci of T2 hyperintensity in the supratentorial white matter are nonspecific but may reflect mild chronic microvascular ischemic changes. Small chronic left cerebellar infarcts are noted. Vascular: Major  vessel flow voids at the skull base are preserved. Skull and upper cervical spine: Normal marrow signal is preserved. Sinuses/Orbits: Paranasal sinuses are aerated. Orbits are unremarkable. Other: Sella is unremarkable.  Mastoid air cells are clear. IMPRESSION: No acute infarction, intracranial hemorrhage, or mass. Mild chronic microvascular ischemic changes. Small chronic left cerebellar infarcts. Electronically Signed   By: Guadlupe Spanish M.D.   On:  10/30/2019 19:22   MR CERVICAL SPINE WO CONTRAST  Result Date: 10/31/2019 CLINICAL DATA:  Retro-orbital headache EXAM: MRI CERVICAL SPINE WITHOUT CONTRAST TECHNIQUE: Multiplanar, multisequence MR imaging of the cervical spine was performed. No intravenous contrast was administered. COMPARISON:  None. FINDINGS: Alignment: There is straightening of the normal cervical lordosis. There is a minimal anterolisthesis of C3 on C4 measuring 2 mm. Vertebrae: The vertebral body heights are well maintained. No fracture, marrow edema,or pathologic marrow infiltration. Fatty endplate changes with anterior osteophytes seen at C6-C7. Cord: Normal signal and morphology. Posterior Fossa, vertebral arteries, paraspinal tissues: The visualized portion of the posterior fossa is unremarkable. Normal flow voids seen within the vertebral arteries. The paraspinal soft tissues are unremarkable. Disc levels: C1-C2: Atlanto-axial junction is normal, without canal narrowing C2-C3: Disc osteophyte complex is seen which causes moderate left neural foraminal narrowing. C3-C4: Disc osteophyte complex and uncovertebral osteophytes are seen which causes severe left and mild right neural foraminal narrowing. C4-C5: Disc osteophyte complex and uncovertebral osteophytes are seen which causes mild bilateral neural foraminal narrowing. C5-C6: Disc osteophyte complex with a right paracentral disc protrusion is seen which contacts the exiting right C6 nerve root. There is severe bilateral neural foraminal narrowing. The central thecal sac is effaced measuring 5 mm in AP dimension. C6-C7: Disc osteophyte complex which is asymmetric to the right with uncovertebral osteophytes are seen which causes severe bilateral neural foraminal narrowing. The central thecal sac is effaced measuring 6 mm in AP diameter. C7-T1: Minimal disc osteophyte complex is seen which causes mild bilateral neural foraminal narrowing. IMPRESSION: Cervical spine spondylosis most  notable at C5-C6 with a right paracentral disc protrusion contacting the exiting right C6 nerve root with severe bilateral neural foraminal narrowing and moderate central canal stenosis. Also at C6-C7 there is severe bilateral neural foraminal narrowing and moderate central canal stenosis. Electronically Signed   By: Jonna Clark M.D.   On: 10/31/2019 03:50   MR THORACIC SPINE WO CONTRAST  Result Date: 10/31/2019 CLINICAL DATA:  Headaches and gait disorder EXAM: MRI THORACIC SPINE WITHOUT CONTRAST TECHNIQUE: Multiplanar, multisequence MR imaging of the thoracic spine was performed. No intravenous contrast was administered. COMPARISON:  None. FINDINGS: Alignment:  Physiologic. Vertebrae: No fracture, evidence of discitis, or bone lesion. Cord:  Normal signal and morphology. Paraspinal and other soft tissues: Negative. Disc levels: No disc herniation or stenosis. IMPRESSION: Normal thoracic spine MRI. Electronically Signed   By: Deatra Robinson M.D.   On: 10/31/2019 04:05   ECHOCARDIOGRAM COMPLETE  Result Date: 10/31/2019   ECHOCARDIOGRAM REPORT   Patient Name:   LAVENA LORETTO Date of Exam: 10/31/2019 Medical Rec #:  [id number]    Height:       61.0 in Accession #:    [id number]   Weight:       185.2 lb Date of Birth:  [birth year]    BSA:          1.83 m Patient Age:    68 years     BP:           183/97 mmHg Patient Gender: F  HR:           89 bpm. Exam Location:  Inpatient Procedure: 2D Echo Indications:    TIA 435.9  History:        Patient has no prior history of Echocardiogram examinations.                 Signs/Symptoms:history of murmur; Risk Factors:Hypertension.  Sonographer:    Delcie Roch Referring Phys: [id number] ARAVIND CHANDRA IMPRESSIONS  1. Left ventricular ejection fraction, by visual estimation, is 60 to 65%. The left ventricle has normal function. There is no left ventricular hypertrophy.  2. Left ventricular diastolic parameters are consistent with Grade I diastolic dysfunction  (impaired relaxation).  3. The left ventricle has no regional wall motion abnormalities.  4. Global right ventricle has normal systolic function.The right ventricular size is normal.  5. Left atrial size was normal.  6. Right atrial size was normal.  7. The mitral valve is normal in structure. Trivial mitral valve regurgitation. No evidence of mitral stenosis.  8. The tricuspid valve is normal in structure.  9. The aortic valve has an indeterminant number of cusps. Aortic valve regurgitation is not visualized. No evidence of aortic valve sclerosis or stenosis. 10. The pulmonic valve was not well visualized. Pulmonic valve regurgitation is not visualized. 11. The inferior vena cava is normal in size with greater than 50% respiratory variability, suggesting right atrial pressure of 3 mmHg. 12. Normal LV systolic function; grade 1 diastolic dysfunction; proximal septal thickening with no LVOT gradient. FINDINGS  Left Ventricle: Left ventricular ejection fraction, by visual estimation, is 60 to 65%. The left ventricle has normal function. The left ventricle has no regional wall motion abnormalities. There is no left ventricular hypertrophy. Left ventricular diastolic parameters are consistent with Grade I diastolic dysfunction (impaired relaxation). Normal left atrial pressure. Right Ventricle: The right ventricular size is normal.Global RV systolic function is has normal systolic function. Left Atrium: Left atrial size was normal in size. Right Atrium: Right atrial size was normal in size Pericardium: There is no evidence of pericardial effusion. Mitral Valve: The mitral valve is normal in structure. Trivial mitral valve regurgitation. No evidence of mitral valve stenosis by observation. Tricuspid Valve: The tricuspid valve is normal in structure. Tricuspid valve regurgitation is not demonstrated. Aortic Valve: The aortic valve has an indeterminant number of cusps. Aortic valve regurgitation is not visualized. The  aortic valve is structurally normal, with no evidence of sclerosis or stenosis. Pulmonic Valve: The pulmonic valve was not well visualized. Pulmonic valve regurgitation is not visualized. Pulmonic regurgitation is not visualized. Aorta: The aortic root is normal in size and structure. Venous: The inferior vena cava is normal in size with greater than 50% respiratory variability, suggesting right atrial pressure of 3 mmHg. IAS/Shunts: No atrial level shunt detected by color flow Doppler. Additional Comments: Normal LV systolic function; grade 1 diastolic dysfunction; proximal septal thickening with no LVOT gradient.  LEFT VENTRICLE PLAX 2D LVIDd:         4.50 cm Diastology LVIDs:         3.30 cm LV e' lateral:   7.51 cm/s LV PW:         0.90 cm LV E/e' lateral: 9.5 LV IVS:        0.90 cm LV e' medial:    6.64 cm/s LV SV:         48 ml   LV E/e' medial:  10.7 LV SV Index:   24.89  RIGHT VENTRICLE  RV S prime:     12.50 cm/s TAPSE (M-mode): 2.3 cm LEFT ATRIUM             Index       RIGHT ATRIUM           Index LA diam:        3.20 cm 1.75 cm/m  RA Area:     11.50 cm LA Vol (A2C):   35.8 ml 19.58 ml/m RA Volume:   25.00 ml  13.68 ml/m LA Vol (A4C):   35.2 ml 19.26 ml/m LA Biplane Vol: 35.8 ml 19.58 ml/m  AORTIC VALVE LVOT Vmax:   95.30 cm/s LVOT Vmean:  65.800 cm/s LVOT VTI:    0.193 m  AORTA Ao Root diam: 3.00 cm MITRAL VALVE MV Area (PHT): 3.12 cm              SHUNTS MV PHT:        70.47 msec            Systemic VTI: 0.19 m MV Decel Time: 243 msec MV E velocity: 71.10 cm/s  103 cm/s MV A velocity: 108.00 cm/s 70.3 cm/s MV E/A ratio:  0.66        1.5  Olga Millers MD Electronically signed by Olga Millers MD Signature Date/Time: 10/31/2019/10:51:18 AM    Final    VAS US CAROTID (at [facility] and [facility] only)  Result Date: 11/01/2019 Carotid Arterial Duplex Study Indications: TIA. Limitations  Today's exam was limited due to sunlight. Performing Technologist: Levada Schilling RDMS, RVT  Examination Guidelines: A  complete evaluation includes B-mode imaging, spectral Doppler, color Doppler, and power Doppler as needed of all accessible portions of each vessel. Bilateral testing is considered an integral part of a complete examination. Limited examinations for reoccurring indications may be performed as noted.  Right Carotid Findings: +----------+-------+--------+--------+----------------------+------------------+             PSV     EDV cm/s Stenosis Plaque Description     Comments                        cm/s                                                                 +----------+-------+--------+--------+----------------------+------------------+  CCA Prox   96      19                                                           +----------+-------+--------+--------+----------------------+------------------+  CCA Distal 76      16                                       intimal thickening  +----------+-------+--------+--------+----------------------+------------------+  ICA Prox   94      22                                                           +----------+-------+--------+--------+----------------------+------------------+  ICA Mid    92      22                                                           +----------+-------+--------+--------+----------------------+------------------+  ICA Distal 74      21                                                           +----------+-------+--------+--------+----------------------+------------------+  ECA        124     13                diffuse and                                                                      heterogenous                               +----------+-------+--------+--------+----------------------+------------------+ +----------+--------+-------+--------+-------------------+             PSV cm/s EDV cms Describe Arm Pressure (mmHG)  +----------+--------+-------+--------+-------------------+  Subclavian 174                                             +----------+--------+-------+--------+-------------------+ +---------+--------+--+--------+--+---------+  Vertebral PSV cm/s 63 EDV cm/s 12 Antegrade  +---------+--------+--+--------+--+---------+  Left Carotid Findings: +----------+--------+--------+--------+------------------------+--------+             PSV cm/s EDV cm/s Stenosis Plaque Description       Comments  +----------+--------+--------+--------+------------------------+--------+  CCA Prox   84       10                                                   +----------+--------+--------+--------+------------------------+--------+  CCA Distal 81       15                diffuse and heterogenous           +----------+--------+--------+--------+------------------------+--------+  ICA Prox   102      27                                                   +----------+--------+--------+--------+------------------------+--------+  ICA Mid    87       17                                                   +----------+--------+--------+--------+------------------------+--------+  ICA Distal 80       25                                                   +----------+--------+--------+--------+------------------------+--------+  ECA        112      12                                                   +----------+--------+--------+--------+------------------------+--------+ +----------+--------+--------+--------+-------------------+             PSV cm/s EDV cm/s Describe Arm Pressure (mmHG)  +----------+--------+--------+--------+-------------------+  Subclavian 222                                             +----------+--------+--------+--------+-------------------+ +---------+--------+--+--------+--+---------+  Vertebral PSV cm/s 80 EDV cm/s 12 Antegrade  +---------+--------+--+--------+--+---------+  Summary: Right Carotid: Velocities in the right ICA are consistent with a 1-39% stenosis. Left Carotid: Velocities in the left ICA are consistent with a 1-39% stenosis.  Vertebrals:  Bilateral vertebral arteries demonstrate antegrade flow. Subclavians: Normal flow hemodynamics were seen in bilateral subclavian              arteries. *See table(s) above for measurements and observations.  Electronically signed by Gretta Began MD on 11/01/2019 at 5:18:30 PM.    Final      Time Spent in minutes  30     Laverna Peace M.D on 11/02/2019 at 4:44 PM  To page go to [email or web address] - password [email or web address]

## 2019-11-02 NOTE — Plan of Care (Signed)
?  Problem: Clinical Measurements: ?Goal: Will remain free from infection ?Outcome: Progressing ?  ?

## 2019-11-02 NOTE — TOC Initial Note (Addendum)
Transition of Care [facility]) - Initial/Assessment Note    Patient Details  Name: Kristy Gill MRN: [id number] Date of Birth: 1950-12-10  [facility] CM/SW Contact:    Marilu Favre, RN Phone Number: 11/02/2019, 2:02 PM  Clinical Narrative:                  Spoke to patient regarding discharge planning.   Patient states she has VA coverage and [employer]. She does not want to provide her [employer] information because she wants the [place] to pay for hospital visit, home health and wheel chair.   Patient understands that she will have to be seen at [facility] prior to them arranging home health and wheelchair. Patient already has a walker with seat and tub bench. Patient provided PCP information [facility] 1 [phone] ext [phone], and consent for NCM call and fax clinical information and orders for home health and wheel chair.    [place] [facility] , spoke to Ms Dema Severin, was told to fax information/ orders to Dr Posey Pronto [phone] . Dr Posey Pronto will review information. If approved someone will call patient directly after she is discharged from hospital with DME and home health information. Patient aware. Requested orders.  Patient has a tub bench and walker with seat at home   [place] notification line ref number [id number] Expected Discharge Plan: [place]     Patient Goals and CMS Choice Patient states their goals for this hospitalization and ongoing recovery are:: to go home CMS Medicare.gov Compare Post Acute Care list provided to:: Patient Choice offered to / list presented to : Patient  Expected Discharge Plan and Services Expected Discharge Plan: [place]   Discharge Planning Services: CM Consult Post Acute Care Choice: Home Health, Durable Medical Equipment Living arrangements for the past 2 months: Single Family Home                                      Prior Living Arrangements/Services Living  arrangements for the past 2 months: Single Family Home Lives with:: Adult Children Patient language and need for interpreter reviewed:: Yes        Need for Family Participation in Patient Care: Yes (Comment) Care giver support system in place?: Yes (comment) Current home services: DME Criminal Activity/Legal Involvement Pertinent to Current Situation/Hospitalization: No - Comment as needed  Activities of Daily Living Home Assistive Devices/Equipment: None ADL Screening (condition at time of admission) Patient's cognitive ability adequate to safely complete daily activities?: Yes Is the patient deaf or have difficulty hearing?: No Does the patient have difficulty seeing, even when wearing glasses/contacts?: No Does the patient have difficulty concentrating, remembering, or making decisions?: No Patient able to express need for assistance with ADLs?: Yes Does the patient have difficulty dressing or bathing?: No Independently performs ADLs?: Yes (appropriate for developmental age) Does the patient have difficulty walking or climbing stairs?: No Weakness of Legs: Right(right artificial knee) Weakness of Arms/Hands: Right(recent surgery)  Permission Sought/Granted   Permission granted to share information with : Yes, Verbal Permission Granted     Permission granted to share info w AGENCY: [facility]        Emotional Assessment   Attitude/Demeanor/Rapport: Engaged Affect (typically observed): Accepting Orientation: : Oriented to Self, Oriented to Place, Oriented to  Time, Oriented to Situation Alcohol / Substance Use:  Not Applicable Psych Involvement: No (comment)  Admission diagnosis:  TIA (transient ischemic attack) [G45.9] Dysequilibrium [R42] Numbness and tingling of right face [R20.0, R20.2] Ataxia [R27.0] Patient Active Problem List   Diagnosis Date Noted  . Ataxia 10/31/2019  . Cervical spondylosis 10/31/2019  . Hypertension complicating diabetes (HCC)  10/31/2019  . RA (rheumatoid arthritis) (HCC) 10/31/2019  . Gout 10/31/2019  . Hypothyroidism 10/31/2019  . Headache 10/31/2019  . Right facial numbness 10/31/2019  . TIA (transient ischemic attack) 10/30/2019   PCP:  Patient, [name] Per Pharmacy:   [facility] - [place], [place] - [address] [place] [address] Phone: [phone] Fax: [phone]  [facility] [address], [place] - [address] [place] [address] Phone: [phone] Fax: [phone]     Social Determinants of Health (SDOH) Interventions    Readmission Risk Interventions No flowsheet data found.

## 2019-11-02 NOTE — Care Management (Signed)
    Durable Medical Equipment  (From admission, onward)         Start     Ordered   11/02/19 1422  For home use only DME 3 n 1  Once     11/02/19 1421   11/02/19 1420  For home use only DME lightweight manual wheelchair with seat cushion  Once    Comments: Patient suffers from Cervical Spine spondylosis with disc protrusion contacting C6 nerve root and severe bilateral neuroforaminal narrowing/moderate central canal stenosis which impairs their ability to perform daily activities like ambulating  in the home.  A cane will not resolve  issue with performing activities of daily living. A wheelchair will allow patient to safely perform daily activities. Patient is not able to propel themselves in the home using a standard weight wheelchair due to Cervical Spine spondylosis with disc protrusion contacting C6 nerve root and severe bilateral neuroforaminal narrowing/moderate central canal stenosis. Patient can self propel in the lightweight wheelchair. Length of need life time . Accessories: elevating leg rests (ELRs), wheel locks, extensions and anti-tippers.  Seat and back cushions   11/02/19 1421   11/02/19 1359  For home use only DME Tub bench  Once     11/02/19 1358   11/02/19 1359  For home use only DME Shower stool  Once     11/02/19 1358   11/02/19 1358  For home use only DME Bedside commode  Once    Question:  Patient needs a bedside commode to treat with the following condition  Answer:  Ataxia   11/02/19 1358

## 2019-11-02 NOTE — Progress Notes (Signed)
Physical Therapy Treatment Patient Details Name: Cornie Mccomber MRN: [id number] DOB: 08/03/1951 Today's Date: 11/02/2019    History of Present Illness Shakema Surita is a 69 y.o. year old female with medical history significant for migraines, type 2 diabetes, RA, HTN, hypothyroidism who presented on 10/30/2019 with acute onset right-sided weakness associated headache, facial numbness, and gait disorder with right-sided drift admitted for work-up of stroke and concern for potential spinal pathology, bur ruled out by neurosurgery.  MRI negative, but chronic small cerebellar infarcts. R UE casted due to recent wrist surgery.    PT Comments    Pt tolerated increased ambulation distance today with less R knee instability but still with 3 episodes of LOB to R with mod A to correct. Will need to have someone with her at home when ambulating. Discussed HHPT, possibility of HHaide, as well as friends from church coming to be with her in the day while her son works. PT will continue to follow.     Follow Up Recommendations  Home health PT;Supervision for mobility/OOB     Equipment Recommendations  None recommended by PT    Recommendations for Other Services       Precautions / Restrictions Precautions Precautions: Fall Precaution Comments: up with assist only; s/p R wrist surgery w/cast in place Required Braces or Orthoses: Splint/Cast Splint/Cast: Rt wrist  Restrictions Weight Bearing Restrictions: No    Mobility  Bed Mobility Overal bed mobility: Needs Assistance Bed Mobility: Supine to Sit     Supine to sit: Supervision     General bed mobility comments: pt up in recliner  Transfers Overall transfer level: Needs assistance Equipment used: 1 person hand held assist Transfers: Sit to/from Stand Sit to Stand: Min guard         General transfer comment: min guard for safety/balance, pt with kinesiotape R knee today, more steady with sit<>stand  Ambulation/Gait Ambulation/Gait  assistance: Mod assist;Min assist Gait Distance (Feet): 250 Feet Assistive device: 1 person hand held assist Gait Pattern/deviations: Step-through pattern;Ataxic;Staggering right;Decreased stride length;Antalgic;Decreased weight shift to right Gait velocity: decreased Gait velocity interpretation: <1.8 ft/sec, indicate of risk for recurrent falls General Gait Details: Min A with 3 episodes of mod A due to LOB to R. Needed 3 rest breaks for R knee. Difficulty with changes in direction. Does better with long range goal to spot as she ambulates   Stairs             Wheelchair Mobility    Modified Rankin (Stroke Patients Only) Modified Rankin (Stroke Patients Only) Pre-Morbid Rankin Score: No symptoms Modified Rankin: Moderately severe disability     Balance Overall balance assessment: Needs assistance Sitting-balance support: No upper extremity supported;Feet supported Sitting balance-Leahy Scale: Good     Standing balance support: Single extremity supported;During functional activity Standing balance-Leahy Scale: Poor Standing balance comment: UE support for balance                            Cognition Arousal/Alertness: Awake/alert Behavior During Therapy: WFL for tasks assessed/performed Overall Cognitive Status: Within Functional Limits for tasks assessed                                        Exercises Other Exercises Other Exercises: patient engaged in dynamic standing balance acitivities at sink, functionally reaching to chair and overhead to transfer washclothes  General Comments General comments (skin integrity, edema, etc.): discussed level of help that she will need at home for safety with mobility      Pertinent Vitals/Pain Pain Assessment: No/denies pain Faces Pain Scale: No hurt    Home Living                      Prior Function            PT Goals (current goals can now be found in the care plan section)  Acute Rehab PT Goals Patient Stated Goal: improve balance  PT Goal Formulation: With patient Time For Goal Achievement: 11/14/19 Potential to Achieve Goals: Good Progress towards PT goals: Progressing toward goals    Frequency    Min 4X/week      PT Plan Discharge plan needs to be updated    Co-evaluation              AM-PAC PT "6 Clicks" Mobility   Outcome Measure  Help needed turning from your back to your side while in a flat bed without using bedrails?: None Help needed moving from lying on your back to sitting on the side of a flat bed without using bedrails?: None Help needed moving to and from a bed to a chair (including a wheelchair)?: A Little Help needed standing up from a chair using your arms (e.g., wheelchair or bedside chair)?: A Lot Help needed to walk in hospital room?: A Lot Help needed climbing 3-5 steps with a railing? : A Lot 6 Click Score: 17    End of Session Equipment Utilized During Treatment: Gait belt Activity Tolerance: Patient tolerated treatment well Patient left: in chair;with call bell/phone within reach Nurse Communication: Mobility status PT Visit Diagnosis: Other abnormalities of gait and mobility (R26.89);Other symptoms and signs involving the nervous system (R29.898);Ataxic gait (R26.0)     Time: 1200-1230 PT Time Calculation (min) (ACUTE ONLY): 30 min  Charges:  $Gait Training: 23-37 mins                     Leighton Roach, [place]  Pager [phone] Office [place] 11/02/2019, 1:31 PM

## 2019-11-02 NOTE — Progress Notes (Signed)
Occupational Therapy Treatment Patient Details Name: Kristy Gill MRN: [id number] DOB: December 21, 1950 Today's Date: 11/02/2019    History of present illness Kristy Gill is a 69 y.o. year old female with medical history significant for migraines, type 2 diabetes, RA, HTN, hypothyroidism who presented on 10/30/2019 with acute onset right-sided weakness associated headache, facial numbness, and gait disorder with right-sided drift admitted for work-up of stroke and concern for potential spinal pathology, bur ruled out by neurosurgery.  MRI negative, but chronic small cerebellar infarcts. R UE casted due to recent wrist surgery.   OT comments  Patient agreeable to OT, noted CIR declined PT and plans to DC home.  She reports her church family can assist intermittently, possible help from her sister for a few days, and her son is present night (works 8-5).  Believe she can dc home with plan to have intermittent assist for mobility/ADLS, aide assist.  Completing transfers with min guard, in room mobility given hand held assist with min assist (1 LOB requiring min-mod assist to correct), and grooming with min guard assist.  Will follow acutely.    Follow Up Recommendations  Home health OT;Supervision - Intermittent;Other (comment)(assist for ADLs, transfers; aide )    Equipment Recommendations  Wheelchair (measurements OT);Tub/shower bench;3 in 1 bedside commode    Recommendations for Other Services      Precautions / Restrictions Precautions Precautions: Fall Precaution Comments: up with assist only; s/p R wrist surgery w/cast in place Required Braces or Orthoses: Splint/Cast Splint/Cast: Rt wrist  Restrictions Weight Bearing Restrictions: No       Mobility Bed Mobility Overal bed mobility: Needs Assistance Bed Mobility: Supine to Sit     Supine to sit: Supervision     General bed mobility comments: S for safety  Transfers Overall transfer level: Needs assistance Equipment used: 1  person hand held assist Transfers: Sit to/from Stand Sit to Stand: Min guard         General transfer comment: min guard for safety/balance    Balance Overall balance assessment: Needs assistance Sitting-balance support: No upper extremity supported;Feet supported Sitting balance-Leahy Scale: Good     Standing balance support: Single extremity supported;During functional activity Standing balance-Leahy Scale: Poor Standing balance comment: UE support for balance                           ADL either performed or assessed with clinical judgement   ADL Overall ADL's : Needs assistance/impaired     Grooming: Wash/dry face;Min guard;Standing                   Toilet Transfer: Minimal assistance;Ambulation           Functional mobility during ADLs: Min guard;Minimal assistance General ADL Comments: patient with 1 LOB towards R side once entering in bathroom with min-mod assist to correct balance      Vision       Perception     Praxis      Cognition Arousal/Alertness: Awake/alert Behavior During Therapy: WFL for tasks assessed/performed Overall Cognitive Status: Within Functional Limits for tasks assessed                                          Exercises Exercises: Other exercises Other Exercises Other Exercises: patient engaged in dynamic standing balance acitivities at sink, functionally reaching to chair and overhead to  transfer washclothes   Shoulder Instructions       General Comments      Pertinent Vitals/ Pain       Pain Assessment: No/denies pain  Home Living                                          Prior Functioning/Environment              Frequency  Min 2X/week        Progress Toward Goals  OT Goals(current goals can now be found in the care plan section)  Progress towards OT goals: Progressing toward goals  Acute Rehab OT Goals Patient Stated Goal: Pt improve balance   OT Goal Formulation: With patient  Plan Frequency remains appropriate;Discharge plan needs to be updated    Co-evaluation                 AM-PAC OT "6 Clicks" Daily Activity     Outcome Measure   Help from another person eating meals?: None Help from another person taking care of personal grooming?: A Little Help from another person toileting, which includes using toliet, bedpan, or urinal?: A Lot Help from another person bathing (including washing, rinsing, drying)?: A Little Help from another person to put on and taking off regular upper body clothing?: A Little Help from another person to put on and taking off regular lower body clothing?: A Lot 6 Click Score: 17    End of Session Equipment Utilized During Treatment: Gait belt  OT Visit Diagnosis: Unsteadiness on feet (R26.81);Ataxia, unspecified (R27.0)   Activity Tolerance Patient tolerated treatment well   Patient Left in chair;with call bell/phone within reach;Other (comment)(PT in room)   Nurse Communication Mobility status        Time: 3086-5784 OT Time Calculation (min): 26 min  Charges: OT General Charges $OT Visit: 1 Visit OT Treatments $Self Care/Home Management : 23-37 mins  Barry Brunner, OT Acute Rehabilitation Services Pager [phone] Office [phone]    Chancy Milroy 11/02/2019, 12:52 PM

## 2019-11-02 NOTE — Discharge Summary (Deleted)
Kristy Gill AVW:[id number] DOB: 09/19/1951 DOA: 10/30/2019  PCP: Patient, [name] Per  Admit date: 10/30/2019 Discharge date: 11/02/2019  Admitted From: home  Disposition:  home  Recommendations for Outpatient Follow-up:  1. Follow up with PCP in 1-2 weeks 2. Outpatient follow up with Neurosurgery or at the [facility] regarding cervical spondylosis and with Neurology regarding headache/ataxia for repeat MRI brain in 4 weeks with thin cuts over the brainstem per neurology recommendations 3. Please obtain BMP/CBC in one week  Home Health: PT/OT   Equipment/Devices: bedside commode, shower/tub bench  Discharge Condition:STABLE   CODE STATUS:FULL     Brief/Interim Summary:  History of present illness:  Kristy Gill is a 69 y.o. year old female with medical history significant for migraines, type 2 diabetes, RA, HTN, hypothyroidism who presented to the hospital on 10/30/2019 with acute onset right-sided weakness associated headache, facial numbness, and gait disorder with right-sided drift admitted for work-up of stroke and concern for potential spinal pathology.  Patient underwent recent orthopedic surgery (10/25/2018) of her right hand related to complications of her chronic RA; during the procedure she had a reaction to the nerve block which she reports this dyspnea and throat swelling while also receiving vancomycin infusion (reports history of hives to vancomycin in the past).  Patient states since being home it took a while to get back to her normal functioning which she thought was related to the sedation wearing off slowly.  Patient stated that day prior to admission in the morning time she noted pain in the center of her face with radiation near her right eye and associated headache.  She again went to sleep and upon waking up a few hours later around 10 AM she noted an inability to prevent herself from drifting to the right while sitting and standing and had to grab the wall to make it safely  during ambulation at home. Of note she was seen by physical therapy and reports recent manipulation of her neck on 1/4.  Hospital course Due to presenting symptoms patient underwent stroke work-up as recommended by neurology.  MRI brain was unremarkable.  However given history of cervical neck degenerative disease neurology recommended MRI thoracic/cervical spine.  Cervical spine imaging shows disc protrusion contacting C6 nerve root and elements of moderate foraminal stenosis. Evaluated by neurosurgery, did not think symptoms were related to cervical stenosis given no weakness and presence of facial symptoms.  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 Outpatient NSG clinic follow up after discharge was recommended.  Headache, facial numbness, rightward drift/ataxia, unclear etiology. Stroke ruled out with MRI.  As mentioned above neurosurgery evaluated cervical neck disease and not likely contributing.  Headache resolved after mag. PT/OT evaluated initially recommended CIR, patient not a candidate. Patient will go home with home health PT/OT and DME patient has 24-hour assistance to be arranged with family, family friends. Outpatient neurology follow-up, with repeat MRI 4 weeks with thin cuts over the brainstem per neurology recommendations. Patient is intolerant of aspirin.  Hypertension, close to goal Patient has several allergies including lisinopril, amlodipine -Consider HCTZ as outpatient pending BP check with PCP and close follow-up  Hyperlipidemia LDL 157 -Patient intolerant of Zetia and statin  Chronic RA, stable Status post right hand surgery, 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 -Continue  pain control tramadol, Flexeril, tyelenol. To maintain outpatient follow-up with orthopedics  Gout-allopurinol,   Hypothyroidism- synthroid   Disposition.  At this time, patient is stable for disposition home.  Patient will have to follow-up with her primary care physician and neurosurgery as outpatient after discharge.   Consultations:  Neurosurgery, neurology   Subjective: Today, patient denies interval complaints.    Physical exam Vitals with BMI 11/03/2019 11/02/2019 11/02/2019  Height - - -  Weight - - -  BMI - - -  Systolic 139 136 638  Diastolic 65 72 66  Pulse 71 80 82  General:  Average built, not in obvious distress HENT: Normocephalic, pupils equally reacting to light and accommodation.  No scleral pallor or icterus noted. Oral mucosa is moist.  Chest:  Clear breath sounds.  Diminished breath sounds bilaterally. No crackles or wheezes.  CVS: S1 &S2 heard. No murmur.  Regular rate and rhythm. Abdomen: Soft, nontender, nondistended.  Bowel sounds are heard.  Liver is not palpable, no abdominal mass palpated Extremities: No cyanosis, clubbing or edema.  Peripheral pulses are palpable. Psych: Alert, awake and oriented, normal mood CNS:  No cranial nerve deficits.  Power equal in all extremities.  No sensory deficits noted.  No cerebellar signs.   Skin: Warm and dry.  No rashes noted.   Discharge Diagnoses:  Active Problems:   TIA (transient ischemic attack)   Ataxia   Cervical spondylosis   Hypertension complicating diabetes (HCC)   RA (rheumatoid arthritis) (HCC)   Gout   Hypothyroidism   Headache   Right facial numbness   Allergies as of 11/03/2019      Reactions   Ceclor [cefaclor] Shortness Of Breath, Itching, Rash   Metformin And Related Shortness Of Breath, Rash, Other (See Comments)   Fatigue, scalp rash, shakiness   Voltaren [diclofenac] Shortness Of Breath, Swelling   Reaction to gel - throat swelling   Morphine And Related Nausea And Vomiting    Projectile vomiting   Amlodipine Swelling, Other (See Comments)   Minor shortness of breath, fatigue, headache,    Arava [leflunomide] Nausea And Vomiting, Other (See Comments)   Chest pain, tachycardia, elevated bp   Atorvastatin Other (See Comments)   Severe joint pain and abdominal pain   Boswellia Hives   Cephalosporins Other (See Comments)   Unknown reaction   Clindamycin/lincomycin Diarrhea   Severe diarrhea   Gemfibrozil Itching, Nausea And Vomiting, Other (See Comments)   Muscle pain, hot flashes   Lisinopril Cough   Lodine [etodolac] Other (See Comments)   Stomach and abdominal pain, mouth ulcers   Medrol [methylprednisolone] Other (See Comments)   Stomach pain/ tachycardia   Methotrexate Derivatives Other (See Comments)   Fatigue/ GI symptoms/ headache/ muscle pain/ face and scalp rash, mouth ulcers   Niacin And Related Other (See Comments)   Flushing, heartburn, insomnia, joint pain, elevated heart rate   Plaquenil [hydroxychloroquine] Itching, Other (See Comments)   Blisters on scalp   Pravastatin Other (See Comments)   Muscle pain   Rayos [prednisone] Other (See Comments)   Stomach pain, tachycardia, muscle spasms, parathesia   Sulfa Antibiotics Other (See Comments)   Unknown reaction   Unasyn [ampicillin-sulbactam Sodium] Itching, Other (See Comments)   Blisters on scalp   Zocor [simvastatin] Diarrhea, Other (See Comments)   Abdominal pain,    Actemra [tocilizumab] Rash, Other (See Comments)   Facial rash, rash at injection site, rectal bleeding, headache   Baclofen Rash   Petechial face rash  Enbrel [etanercept] Rash, Other (See Comments)   Redness, swelling at injection site, tightness in throat w/ mild respiratory distress, muscle spasms, bell's palsy, discoid lupus   Ibuprofen Swelling, Rash   Swollen face   Other Itching, Rash, Other (See Comments)   monsel solution caused rash, itching   Penicillins Rash   Did it involve swelling of the  face/tongue/throat, SOB, or low BP? No Did it involve sudden or severe rash/hives, skin peeling, or any reaction on the inside of your mouth or nose? Yes Did you need to seek medical attention at a hospital or doctor's office? No When did it last happen?approx 69 years old If all above answers are "NO", may proceed with cephalosporin use.   Simponi [golimumab] Hives, Itching, Rash   Zetia [ezetimibe] Rash, Other (See Comments), Cough   Headache, itchy rash, fatigue, dry mouth, dry cough      Medication List    TAKE these medications   acetaminophen 500 MG tablet Commonly known as: TYLENOL Take 1,000 mg by mouth daily as needed (pain).   acetaminophen 650 MG CR tablet Commonly known as: TYLENOL Take 1,300 mg by mouth at bedtime.   allopurinol 100 MG tablet Commonly known as: ZYLOPRIM Take 100 mg by mouth daily with supper.   colchicine 0.6 MG tablet Take 0.6 mg by mouth daily with lunch.   Cran-Max 500 MG Caps Generic drug: Cranberry Take 500 mg by mouth daily after breakfast.   cyclobenzaprine 10 MG tablet Commonly known as: FLEXERIL Take 10 mg by mouth See admin instructions. Take one tablet (10 mg) by mouth daily at bedtime, may also take one tablet (10 mg) daily as needed for pain   diphenhydrAMINE 12.5 MG/5ML liquid Commonly known as: BENADRYL Take 12.5 mg by mouth daily as needed for itching or allergies.   EpiPen 2-Pak 0.3 mg/0.3 mL Soaj injection Generic drug: EPINEPHrine Inject 0.3 mg into the muscle once as needed for anaphylaxis (severe allergic reaction).   levothyroxine 112 MCG tablet Commonly known as: SYNTHROID Take 112 mcg by mouth daily before breakfast.   PROBIOTIC PO Take 1 tablet by mouth 3 (three) times a week.   REFRESH OP Place 1 drop into both eyes daily as needed (itching/ dry eyes).   traMADol 50 MG tablet Commonly known as: ULTRAM Take 50 mg by mouth every 8 (eight) hours as needed (pain).   TURMERIC PO Take 1,500 mg by mouth  daily.   Vitamin D-3 125 MCG (5000 UT) Tabs Take 5,000 Units by mouth at bedtime.            Durable Medical Equipment  (From admission, onward)         Start     Ordered   11/02/19 1422  For home use only DME 3 n 1  Once     11/02/19 1421   11/02/19 1420  For home use only DME lightweight manual wheelchair with seat cushion  Once    Comments: Patient suffers from Cervical Spine spondylosis with disc protrusion contacting C6 nerve root and severe bilateral neuroforaminal narrowing/moderate central canal stenosis which impairs their ability to perform daily activities like ambulating  in the home.  A cane will not resolve  issue with performing activities of daily living. A wheelchair will allow patient to safely perform daily activities. Patient is not able to propel themselves in the home using a standard weight wheelchair due to Cervical Spine spondylosis with disc protrusion contacting C6 nerve root and severe bilateral neuroforaminal narrowing/moderate central  canal stenosis. Patient can self propel in the lightweight wheelchair. Length of need life time . Accessories: elevating leg rests (ELRs), wheel locks, extensions and anti-tippers.  Seat and back cushions   11/02/19 1421   11/02/19 1359  For home use only DME Tub bench  Once     11/02/19 1358   11/02/19 1359  For home use only DME Shower stool  Once     11/02/19 1358   11/02/19 1358  For home use only DME Bedside commode  Once    Question:  Patient needs a bedside commode to treat with the following condition  Answer:  Ataxia   11/02/19 1358             Durable Medical Equipment  (From admission, onward)         Start     Ordered   11/02/19 1422  For home use only DME 3 n 1  Once     11/02/19 1421   11/02/19 1420  For home use only DME lightweight manual wheelchair with seat cushion  Once    Comments: Patient suffers from Cervical Spine spondylosis with disc protrusion contacting C6 nerve root and severe  bilateral neuroforaminal narrowing/moderate central canal stenosis which impairs their ability to perform daily activities like ambulating  in the home.  A cane will not resolve  issue with performing activities of daily living. A wheelchair will allow patient to safely perform daily activities. Patient is not able to propel themselves in the home using a standard weight wheelchair due to Cervical Spine spondylosis with disc protrusion contacting C6 nerve root and severe bilateral neuroforaminal narrowing/moderate central canal stenosis. Patient can self propel in the lightweight wheelchair. Length of need life time . Accessories: elevating leg rests (ELRs), wheel locks, extensions and anti-tippers.  Seat and back cushions   11/02/19 1421   11/02/19 1359  For home use only DME Tub bench  Once     11/02/19 1358   11/02/19 1359  For home use only DME Shower stool  Once     11/02/19 1358   11/02/19 1358  For home use only DME Bedside commode  Once    Question:  Patient needs a bedside commode to treat with the following condition  Answer:  Ataxia   11/02/19 1358                Durable Medical Equipment  (From admission, onward)         Start     Ordered   11/02/19 1422  For home use only DME 3 n 1  Once     11/02/19 1421   11/02/19 1420  For home use only DME lightweight manual wheelchair with seat cushion  Once    Comments: Patient suffers from Cervical Spine spondylosis with disc protrusion contacting C6 nerve root and severe bilateral neuroforaminal narrowing/moderate central canal stenosis which impairs their ability to perform daily activities like ambulating  in the home.  A cane will not resolve  issue with performing activities of daily living. A wheelchair will allow patient to safely perform daily activities. Patient is not able to propel themselves in the home using a standard weight wheelchair due to Cervical Spine spondylosis with disc protrusion contacting C6 nerve root  and severe bilateral neuroforaminal narrowing/moderate central canal stenosis. Patient can self propel in the lightweight wheelchair. Length of need life time . Accessories: elevating leg rests (ELRs), wheel locks, extensions and anti-tippers.  Seat and back cushions  11/02/19 1421   11/02/19 1359  For home use only DME Tub bench  Once     11/02/19 1358   11/02/19 1359  For home use only DME Shower stool  Once     11/02/19 1358   11/02/19 1358  For home use only DME Bedside commode  Once    Question:  Patient needs a bedside commode to treat with the following condition  Answer:  Ataxia   11/02/19 1358              Durable Medical Equipment  (From admission, onward)         Start     Ordered   11/02/19 1359  For home use only DME Tub bench  Once     11/02/19 1358   11/02/19 1359  For home use only DME Shower stool  Once     11/02/19 1358   11/02/19 1358  For home use only DME Bedside commode  Once    Question:  Patient needs a bedside commode to treat with the following condition  Answer:  Ataxia   11/02/19 1358         Follow-up Information    Lisbeth Renshaw, MD. Schedule an appointment as soon as possible for a visit.   Specialty: Neurosurgery Contact information: [address] [place] [address] [phone]          Allergies  Allergen Reactions  . Ceclor [Cefaclor] Shortness Of Breath, Itching and Rash  . Metformin And Related Shortness Of Breath, Rash and Other (See Comments)    Fatigue, scalp rash, shakiness  . Voltaren [Diclofenac] Shortness Of Breath and Swelling    Reaction to gel - throat swelling  . Morphine And Related Nausea And Vomiting    Projectile vomiting  . Amlodipine Swelling and Other (See Comments)    Minor shortness of breath, fatigue, headache,   . Arava [Leflunomide] Nausea And Vomiting and Other (See Comments)    Chest pain, tachycardia, elevated bp  . Atorvastatin Other (See Comments)    Severe joint  pain and abdominal pain  . Boswellia Hives  . Cephalosporins Other (See Comments)    Unknown reaction  . Clindamycin/Lincomycin Diarrhea    Severe diarrhea  . Gemfibrozil Itching, Nausea And Vomiting and Other (See Comments)    Muscle pain, hot flashes  . Lisinopril Cough  . Lodine [Etodolac] Other (See Comments)    Stomach and abdominal pain, mouth ulcers  . Medrol [Methylprednisolone] Other (See Comments)    Stomach pain/ tachycardia  . Methotrexate Derivatives Other (See Comments)    Fatigue/ GI symptoms/ headache/ muscle pain/ face and scalp rash, mouth ulcers  . Niacin And Related Other (See Comments)    Flushing, heartburn, insomnia, joint pain, elevated heart rate  . Plaquenil [Hydroxychloroquine] Itching and Other (See Comments)    Blisters on scalp   . Pravastatin Other (See Comments)    Muscle pain  . Rayos [Prednisone] Other (See Comments)    Stomach pain, tachycardia, muscle spasms, parathesia  . Sulfa Antibiotics Other (See Comments)    Unknown reaction  . Unasyn [Ampicillin-Sulbactam Sodium] Itching and Other (See Comments)    Blisters on scalp   . Zocor [Simvastatin] Diarrhea and Other (See Comments)    Abdominal pain,   . Actemra [Tocilizumab] Rash and Other (See Comments)    Facial rash, rash at injection site, rectal bleeding, headache  . Baclofen Rash    Petechial face rash  . Enbrel [Etanercept] Rash  and Other (See Comments)    Redness, swelling at injection site, tightness in throat w/ mild respiratory distress, muscle spasms, bell's palsy, discoid lupus  . Ibuprofen Swelling and Rash    Swollen face  . Other Itching, Rash and Other (See Comments)    monsel solution caused rash, itching  . Penicillins Rash    Did it involve swelling of the face/tongue/throat, SOB, or low BP? No Did it involve sudden or severe rash/hives, skin peeling, or any reaction on the inside of your mouth or nose? Yes Did you need to seek medical attention at a hospital or  doctor's office? No When did it last happen?approx 69 years old If all above answers are "NO", may proceed with cephalosporin use.  . Simponi [Golimumab] Hives, Itching and Rash  . Zetia [Ezetimibe] Rash, Other (See Comments) and Cough    Headache, itchy rash, fatigue, dry mouth, dry cough    The results of significant diagnostics from this hospitalization (including imaging, microbiology, ancillary and laboratory) are listed below for reference.     Microbiology: Recent Results (from the past 240 hour(s))  SARS CORONAVIRUS 2 (TAT 6-24 HRS) Nasopharyngeal Nasopharyngeal Swab     Status: None   Collection Time: 10/30/19  9:39 PM   Specimen: Nasopharyngeal Swab  Result Value Ref Range Status   SARS Coronavirus 2 NEGATIVE NEGATIVE Final    Comment: (NOTE) SARS-CoV-2 target nucleic acids are NOT DETECTED. The SARS-CoV-2 RNA is generally detectable in upper and lower respiratory specimens during the acute phase of infection. Negative results do not preclude SARS-CoV-2 infection, do not rule out co-infections with other pathogens, and should not be used as the sole basis for treatment or other patient management decisions. Negative results must be combined with clinical observations, patient history, and epidemiological information. The expected result is Negative. Fact Sheet for Patients: SugarRoll.be Fact Sheet for Healthcare Providers: https://www.woods-mathews.com/ This test is not yet approved or cleared by the [place] FDA and  has been authorized for detection and/or diagnosis of SARS-CoV-2 by FDA under an Emergency Use Authorization (EUA). This EUA will remain  in effect (meaning this test can be used) for the duration of the COVID-19 declaration under Section 56 4(b)(1) of the Act, 21 U.S.C. section 360bbb-3(b)(1), unless the authorization is terminated or revoked sooner. Performed at [facility], [address] [place], [place] [address]      Labs: BNP (last 3 results) No results for input(s): BNP in the last 8760 hours. Basic Metabolic Panel: Recent Labs  Lab 10/30/19 1415 10/30/19 1421 11/02/19 0208  NA 135 136 137  K 3.7 3.9 3.8  CL 95* 98 100  CO2 27  --  24  GLUCOSE 139* 134* 142*  BUN 12 14 19   CREATININE 0.72 0.70 0.90  CALCIUM 9.7  --  9.7   Liver Function Tests: Recent Labs  Lab 10/30/19 1415  AST 21  ALT 26  ALKPHOS 75  BILITOT 0.8  PROT 8.0  ALBUMIN 4.2   No results for input(s): LIPASE, AMYLASE in the last 168 hours. No results for input(s): AMMONIA in the last 168 hours. CBC: Recent Labs  Lab 10/30/19 1415 10/30/19 1421  WBC 9.1  --   NEUTROABS 6.8  --   HGB 14.6 15.3*  HCT 44.8 45.0  MCV 88.2  --   PLT 235  --    Cardiac Enzymes: No results for input(s): CKTOTAL, CKMB, CKMBINDEX, TROPONINI in the last 168 hours. BNP: Invalid input(s): POCBNP CBG:  Recent Labs  Lab 10/30/19 1252  GLUCAP 128*   D-Dimer No results for input(s): DDIMER in the last 72 hours. Hgb A1c Recent Labs    10/31/19 0429  HGBA1C 6.7*   Lipid Profile Recent Labs    10/31/19 0430  CHOL 239*  HDL 42  LDLCALC 157*  TRIG 199*  CHOLHDL 5.7   Thyroid function studies No results for input(s): TSH, T4TOTAL, T3FREE, THYROIDAB in the last 72 hours.  Invalid input(s): FREET3 Anemia work up No results for input(s): VITAMINB12, FOLATE, FERRITIN, TIBC, IRON, RETICCTPCT in the last 72 hours. Urinalysis    Component Value Date/Time   COLORURINE YELLOW 10/30/2019 1350   APPEARANCEUR HAZY (A) 10/30/2019 1350   LABSPEC 1.016 10/30/2019 1350   PHURINE 5.0 10/30/2019 1350   GLUCOSEU NEGATIVE 10/30/2019 1350   HGBUR SMALL (A) 10/30/2019 1350   BILIRUBINUR NEGATIVE 10/30/2019 1350   KETONESUR NEGATIVE 10/30/2019 1350   PROTEINUR 30 (A) 10/30/2019 1350   NITRITE POSITIVE (A) 10/30/2019 1350   LEUKOCYTESUR TRACE (A) 10/30/2019 1350   Sepsis Labs Invalid input(s):  PROCALCITONIN,  WBC,  LACTICIDVEN Microbiology Recent Results (from the past 240 hour(s))  SARS CORONAVIRUS 2 (TAT 6-24 HRS) Nasopharyngeal Nasopharyngeal Swab     Status: None   Collection Time: 10/30/19  9:39 PM   Specimen: Nasopharyngeal Swab  Result Value Ref Range Status   SARS Coronavirus 2 NEGATIVE NEGATIVE Final    Comment: (NOTE) SARS-CoV-2 target nucleic acids are NOT DETECTED. The SARS-CoV-2 RNA is generally detectable in upper and lower respiratory specimens during the acute phase of infection. Negative results do not preclude SARS-CoV-2 infection, do not rule out co-infections with other pathogens, and should not be used as the sole basis for treatment or other patient management decisions. Negative results must be combined with clinical observations, patient history, and epidemiological information. The expected result is Negative. Fact Sheet for Patients: HairSlick.no Fact Sheet for Healthcare Providers: quierodirigir.com This test is not yet approved or cleared by the [place] FDA and  has been authorized for detection and/or diagnosis of SARS-CoV-2 by FDA under an Emergency Use Authorization (EUA). This EUA will remain  in effect (meaning this test can be used) for the duration of the COVID-19 declaration under Section 56 4(b)(1) of the Act, 21 U.S.C. section 360bbb-3(b)(1), unless the authorization is terminated or revoked sooner. Performed at [facility], [address] [place], [place] [address]      Time coordinating discharge: 36 minutes  SIGNED:   Loistine Chance, MD  [facility] 11/02/2019, 1:59 PM

## 2019-11-03 NOTE — Progress Notes (Signed)
Occupational Therapy Treatment Patient Details Name: Renezmae Canlas MRN: [id number] DOB: [birth year] Today's Date: 11/03/2019    History of present illness Yamilee Harmes is a 69 y.o. year old female with medical history significant for migraines, type 2 diabetes, RA, HTN, hypothyroidism who presented on 10/30/2019 with acute onset right-sided weakness associated headache, facial numbness, and gait disorder with right-sided drift admitted for work-up of stroke and concern for potential spinal pathology, bur ruled out by neurosurgery.  MRI negative, but chronic small cerebellar infarcts. R UE casted due to recent wrist surgery.   OT comments  Patient progressing slowly towards goals.  She is demonstrating ability to complete basic sit to stand transfer with supervision, min guard for balance during dynamic tasks and mobility.  NO losses of balance today, reviewed energy conservation techniques and need for frequent rest breaks, fall prevention and safety pt agreeable.  Completes grooming at sink with min assist, LB ADLs with min assist and UE dressing with setup assist; mainly limited by R UE cast/decreased functional use; min guard to close supervision for balance today.  Will follow acutely. She plans to have support from church friends while her son is at work; continue to recommend aide for IADL and bathing support, as well as HHOT for return to independence.    Follow Up Recommendations  Home health OT;Other (comment);Supervision - Intermittent(aide; assist with ADLs/transfers/IADLs  )    Equipment Recommendations  Wheelchair (measurements OT);Tub/shower bench;3 in 1 bedside commode    Recommendations for Other Services Rehab consult    Precautions / Restrictions Precautions Precautions: Fall Precaution Comments: up with assist only; s/p R wrist surgery w/cast in place Required Braces or Orthoses: Splint/Cast Splint/Cast: Rt wrist  Restrictions Weight Bearing Restrictions: No        Mobility Bed Mobility               General bed mobility comments: seated EOB upon entry   Transfers Overall transfer level: Needs assistance Equipment used: 1 person hand held assist Transfers: Sit to/from Stand Sit to Stand: Supervision         General transfer comment: basic sit to stand with supervision    Balance Overall balance assessment: Needs assistance Sitting-balance support: No upper extremity supported;Feet supported Sitting balance-Leahy Scale: Good     Standing balance support: No upper extremity supported;During functional activity;Single extremity supported Standing balance-Leahy Scale: Poor Standing balance comment: relaint on UE support                           ADL either performed or assessed with clinical judgement   ADL Overall ADL's : Needs assistance/impaired Eating/Feeding: Set up;Sitting Eating/Feeding Details (indicate cue type and reason): d/t R UE cast Grooming: Minimal assistance;Standing;Brushing hair;Applying deodorant Grooming Details (indicate cue type and reason): shower cap to hair, brushing hair and applying deodorant to L underarm; min guard to close supervision for balance but limited by R UE functional use  Upper Body Bathing: Minimal assistance;Standing Upper Body Bathing Details (indicate cue type and reason): to wash R underarm      Upper Body Dressing : Set up;Supervision/safety;Standing Upper Body Dressing Details (indicate cue type and reason): to don new gown Lower Body Dressing: Minimal assistance;Sit to/from stand Lower Body Dressing Details (indicate cue type and reason): simulated LB dressing, reviewed compensatory techniques for donning pants/underwear, use of slip on shoes  Toilet Transfer: Min guard;Ambulation   Toileting- Architect and Hygiene: Min guard;Supervision/safety;Sit to/from stand Toileting - [employer] fast streamer  Manipulation Details (indicate cue type and reason): min guard to close  superviison for safety      Functional mobility during ADLs: Min guard;Supervision/safety(min guard for mobility, supervision for transfer) General ADL Comments: reviewed fall prevention and safety, energy conservation and no losses of balance noted     Vision       Perception     Praxis      Cognition Arousal/Alertness: Awake/alert Behavior During Therapy: WFL for tasks assessed/performed Overall Cognitive Status: Within Functional Limits for tasks assessed                                          Exercises     Shoulder Instructions       General Comments discussed assist at home, plans to have support when her son is at work from church friends, whom she reports can provide her hand held support during mobility; reviewed energy conservation techniques and recommendations to rest once she start to feel first signs of weakness     Pertinent Vitals/ Pain       Pain Assessment: No/denies pain  Home Living                                          Prior Functioning/Environment              Frequency  Min 2X/week        Progress Toward Goals  OT Goals(current goals can now be found in the care plan section)  Progress towards OT goals: Progressing toward goals  Acute Rehab OT Goals Patient Stated Goal: improve balance  OT Goal Formulation: With patient  Plan Discharge plan remains appropriate;Frequency remains appropriate    Co-evaluation                 AM-PAC OT "6 Clicks" Daily Activity     Outcome Measure   Help from another person eating meals?: A Little Help from another person taking care of personal grooming?: A Little Help from another person toileting, which includes using toliet, bedpan, or urinal?: A Little Help from another person bathing (including washing, rinsing, drying)?: A Little Help from another person to put on and taking off regular upper body clothing?: A Little Help from another person  to put on and taking off regular lower body clothing?: A Little 6 Click Score: 18    End of Session Equipment Utilized During Treatment: Gait belt  OT Visit Diagnosis: Unsteadiness on feet (R26.81);Ataxia, unspecified (R27.0)   Activity Tolerance Patient tolerated treatment well   Patient Left in chair;with call bell/phone within reach   Nurse Communication Mobility status        Time: 9470-9628 OT Time Calculation (min): 32 min  Charges: OT General Charges $OT Visit: 1 Visit OT Treatments $Self Care/Home Management : 23-37 mins  [place] Pager [phone] Office [phone]    Delight Stare 11/03/2019, 8:57 AM

## 2019-11-03 NOTE — Progress Notes (Signed)
Physical Therapy Treatment Patient Details Name: Kristy Gill MRN: [id number] DOB: 1951/06/02 Today's Date: 11/03/2019    History of Present Illness Kristy Gill is a 69 y.o. year old female with medical history significant for migraines, type 2 diabetes, RA, HTN, hypothyroidism who presented on 10/30/2019 with acute onset right-sided weakness associated headache, facial numbness, and gait disorder with right-sided drift admitted for work-up of stroke and concern for potential spinal pathology, bur ruled out by neurosurgery.  MRI negative, but chronic small cerebellar infarcts. R UE casted due to recent wrist surgery.    PT Comments    Patient received in chair, pleasant and motivated to participate in PT today. Able to complete functional transfers with S, also showed improvement in mobility and only needed MinA to recover balance on multiple occasions during gait today, also continued to require multiple rest breaks during gait distance due to ongoing issues with R knee today. Reinforced all education provided last session. She was left up in the chair with all needs met this morning. Progressing well with therapy.    Follow Up Recommendations  Home health PT;Supervision for mobility/OOB     Equipment Recommendations  None recommended by PT    Recommendations for Other Services       Precautions / Restrictions Precautions Precautions: Fall Precaution Comments: up with assist only; s/p R wrist surgery w/cast in place Required Braces or Orthoses: Splint/Cast Splint/Cast: Rt wrist  Restrictions Weight Bearing Restrictions: No    Mobility  Bed Mobility               General bed mobility comments: OOB in chair  Transfers Overall transfer level: Needs assistance Equipment used: 1 person hand held assist Transfers: Sit to/from Stand Sit to Stand: Supervision         General transfer comment: basic sit to stand with supervision  Ambulation/Gait Ambulation/Gait  assistance: Min assist Gait Distance (Feet): 250 Feet Assistive device: 1 person hand held assist Gait Pattern/deviations: Step-through pattern;Ataxic;Decreased stride length;Antalgic;Decreased weight shift to right;Drifts right/left Gait velocity: decreased   General Gait Details: gait much improved today, required 4 standing rest breaks due to R LE weakness but only MinA for balance during gait. Difficulty with changes in direction and turns   Marine scientist [place] (Stroke Patients Only)       Balance Overall balance assessment: Needs assistance Sitting-balance support: No upper extremity supported;Feet supported Sitting balance-Leahy Scale: Good     Standing balance support: No upper extremity supported;During functional activity Standing balance-Leahy Scale: Poor Standing balance comment: MinA in dynamic scenarios                            Cognition Arousal/Alertness: Awake/alert Behavior During Therapy: WFL for tasks assessed/performed Overall Cognitive Status: Within Functional Limits for tasks assessed                                        Exercises      General Comments General comments (skin integrity, edema, etc.): reinforced all education from prior sessions      Pertinent Vitals/Pain Pain Assessment: No/denies pain Faces Pain Scale: No hurt    Home Living  Prior Function            PT Goals (current goals can now be found in the care plan section) Acute Rehab PT Goals Patient Stated Goal: improve balance  PT Goal Formulation: With patient Time For Goal Achievement: 11/14/19 Potential to Achieve Goals: Good Progress towards PT goals: Progressing toward goals    Frequency    Min 4X/week      PT Plan Current plan remains appropriate    Co-evaluation              AM-PAC PT "6 Clicks" Mobility   Outcome Measure  Help needed  turning from your back to your side while in a flat bed without using bedrails?: None Help needed moving from lying on your back to sitting on the side of a flat bed without using bedrails?: None Help needed moving to and from a bed to a chair (including a wheelchair)?: A Little Help needed standing up from a chair using your arms (e.g., wheelchair or bedside chair)?: A Little Help needed to walk in hospital room?: A Little Help needed climbing 3-5 steps with a railing? : A Lot 6 Click Score: 19    End of Session Equipment Utilized During Treatment: Gait belt Activity Tolerance: Patient tolerated treatment well Patient left: in chair;with call bell/phone within reach   PT Visit Diagnosis: Other abnormalities of gait and mobility (R26.89);Other symptoms and signs involving the nervous system (R29.898);Ataxic gait (R26.0)     Time: 0569-7948 PT Time Calculation (min) (ACUTE ONLY): 17 min  Charges:  $Gait Training: 8-22 mins                     Windell Norfolk, DPT, PN1   Supplemental Physical Therapist [place]    Pager [phone] Acute Rehab Office [phone]

## 2019-11-03 NOTE — TOC Progression Note (Addendum)
Transition of Care [facility]) - Progression Note    Patient Details  Name: Kristy Gill MRN: [id number] Date of Birth: [birth year]  [facility] CM/SW Contact  Nadene Rubins Adria Devon, RN Phone Number: 11/03/2019, 2:25 PM  Clinical Narrative:     Porfirio Mylar [facility] given all information regarding DME and home health . Patient will need to be seen at [facility] before they will approve [facility] and DME Patient aware DC summary faxed [phone]  Expected Discharge Plan: Home w Home Health Services    Expected Discharge Plan and Services Expected Discharge Plan: Home w Home Health Services   Discharge Planning Services: CM Consult Post Acute Care Choice: Home Health, Durable Medical Equipment Living arrangements for the past 2 months: Single Family Home                                       Social Determinants of Health (SDOH) Interventions    Readmission Risk Interventions No flowsheet data found.

## 2019-11-03 NOTE — Discharge Summary (Signed)
Kristy Gill GEX:[id number] DOB: Aug 03, 1951 DOA: 10/30/2019  PCP: Patient, [name] Per  Admit date: 10/30/2019 Discharge date: 11/03/2019  Admitted From: home  Disposition:  home  Recommendations for Outpatient Follow-up:  1. Follow up with PCP in 1-2 weeks 2. Outpatient follow up with Neurosurgery or at the [facility] regarding cervical spondylosis and with Neurology regarding headache/ataxia for repeat MRI brain in 4 weeks with thin cuts over the brainstem per neurology recommendations 3. Please obtain BMP/CBC in one week  Home Health: PT/OT   Equipment/Devices: bedside commode, shower/tub bench  Discharge Condition:STABLE   CODE STATUS:FULL     Brief/Interim Summary:  History of present illness:  Kristy Gill is a 69 y.o. year old female with medical history significant for migraines, type 2 diabetes, RA, HTN, hypothyroidism who presented to the hospital on 10/30/2019 with acute onset right-sided weakness associated headache, facial numbness, and gait disorder with right-sided drift admitted for work-up of stroke and concern for potential spinal pathology.  Patient underwent recent orthopedic surgery (10/25/2018) of her right hand related to complications of her chronic RA; during the procedure she had a reaction to the nerve block which she reports this dyspnea and throat swelling while also receiving vancomycin infusion (reports history of hives to vancomycin in the past).  Patient states since being home it took a while to get back to her normal functioning which she thought was related to the sedation wearing off slowly.  Patient stated that day prior to admission in the morning time she noted pain in the center of her face with radiation near her right eye and associated headache.  She again went to sleep and upon waking up a few hours later around 10 AM she noted an inability to prevent herself from drifting to the right while sitting and standing and had to grab the wall to make it safely  during ambulation at home. Of note she was seen by physical therapy and reports recent manipulation of her neck on 1/4.  Hospital course Due to presenting symptoms patient underwent stroke work-up as recommended by neurology.  MRI brain was unremarkable.  However given history of cervical neck degenerative disease neurology recommended MRI thoracic/cervical spine.  Cervical spine imaging shows disc protrusion contacting C6 nerve root and elements of moderate foraminal stenosis. Evaluated by neurosurgery, did not think symptoms were related to cervical stenosis given no weakness and presence of facial symptoms.  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 Outpatient NSG clinic follow up after discharge was recommended.  Headache, facial numbness, rightward drift/ataxia, unclear etiology. Stroke ruled out with MRI.  As mentioned above neurosurgery evaluated cervical neck disease and not likely contributing.  Headache resolved after mag. PT/OT evaluated initially recommended CIR, patient not a candidate. Patient will go home with home health PT/OT and DME patient has 24-hour assistance to be arranged with family, family friends. Outpatient neurology follow-up, with repeat MRI 4 weeks with thin cuts over the brainstem per neurology recommendations. Patient is intolerant of aspirin.  Hypertension, close to goal Patient has several allergies including lisinopril, amlodipine -Consider HCTZ as outpatient pending BP check with PCP and close follow-up  Hyperlipidemia LDL 157 -Patient intolerant of Zetia and statin  Chronic RA, stable Status post right hand surgery, 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 -Continue  pain control tramadol, Flexeril, tyelenol. To maintain outpatient follow-up with orthopedics  Gout-allopurinol,   Hypothyroidism- synthroid   Disposition.  At this time, patient is stable for disposition home.  Patient will have to follow-up with her primary care physician and neurosurgery as outpatient after discharge.   Consultations:  Neurosurgery, neurology   Subjective: Today, patient denies interval complaints.    Physical exam Vitals with BMI 11/03/2019 11/02/2019 11/02/2019  Height - - -  Weight - - -  BMI - - -  Systolic 139 136 016  Diastolic 65 72 66  Pulse 71 80 82  General:  Average built, not in obvious distress HENT: Normocephalic, pupils equally reacting to light and accommodation.  No scleral pallor or icterus noted. Oral mucosa is moist.  Chest:  Clear breath sounds.  Diminished breath sounds bilaterally. No crackles or wheezes.  CVS: S1 &S2 heard. No murmur.  Regular rate and rhythm. Abdomen: Soft, nontender, nondistended.  Bowel sounds are heard.  Liver is not palpable, no abdominal mass palpated Extremities: No cyanosis, clubbing or edema.  Peripheral pulses are palpable. Psych: Alert, awake and oriented, normal mood CNS:  No cranial nerve deficits.  Power equal in all extremities.  No sensory deficits noted.  No cerebellar signs.   Skin: Warm and dry.  No rashes noted.   Discharge Diagnoses:  Active Problems:   TIA (transient ischemic attack)   Ataxia   Cervical spondylosis   Hypertension complicating diabetes (HCC)   RA (rheumatoid arthritis) (HCC)   Gout   Hypothyroidism   Headache   Right facial numbness  Discharge Instructions    Call MD for:   Complete by: As directed    Worsening symptoms   Diet - low sodium heart healthy   Complete by: As directed    Discharge instructions   Complete by: As directed    Follow up with your primary care provider at [facility] in one week. Discuss about referral to neurosurgery for your spine issue and plan for  outpatient MRI   Increase activity slowly   Complete by: As directed      Allergies as of 11/03/2019      Reactions   Ceclor [cefaclor] Shortness Of Breath, Itching, Rash   Metformin And Related Shortness Of Breath, Rash, Other (See Comments)   Fatigue, scalp rash, shakiness   Voltaren [diclofenac] Shortness Of Breath, Swelling   Reaction to gel - throat swelling   Morphine And Related Nausea And Vomiting   Projectile vomiting   Amlodipine Swelling, Other (See Comments)   Minor shortness of breath, fatigue, headache,    Arava [leflunomide] Nausea And Vomiting, Other (See Comments)   Chest pain, tachycardia, elevated bp   Atorvastatin Other (See Comments)   Severe joint pain and abdominal pain   Boswellia Hives   Cephalosporins Other (See Comments)   Unknown reaction   Clindamycin/lincomycin Diarrhea   Severe diarrhea   Gemfibrozil Itching, Nausea And Vomiting, Other (See Comments)   Muscle pain, hot flashes   Lisinopril Cough   Lodine [etodolac] Other (See Comments)   Stomach and abdominal pain, mouth ulcers   Medrol [methylprednisolone] Other (See Comments)   Stomach pain/ tachycardia   Methotrexate Derivatives Other (See Comments)   Fatigue/ GI symptoms/ headache/ muscle pain/ face and scalp rash, mouth ulcers   Niacin And Related Other (See Comments)   Flushing, heartburn, insomnia, joint pain, elevated heart rate   Plaquenil [hydroxychloroquine] Itching, Other (See Comments)   Blisters on scalp   Pravastatin Other (See  Comments)   Muscle pain   Rayos [prednisone] Other (See Comments)   Stomach pain, tachycardia, muscle spasms, parathesia   Sulfa Antibiotics Other (See Comments)   Unknown reaction   Unasyn [ampicillin-sulbactam Sodium] Itching, Other (See Comments)   Blisters on scalp   Zocor [simvastatin] Diarrhea, Other (See Comments)   Abdominal pain,    Actemra [tocilizumab] Rash, Other (See Comments)   Facial rash, rash at injection site, rectal bleeding,  headache   Baclofen Rash   Petechial face rash   Enbrel [etanercept] Rash, Other (See Comments)   Redness, swelling at injection site, tightness in throat w/ mild respiratory distress, muscle spasms, bell's palsy, discoid lupus   Ibuprofen Swelling, Rash   Swollen face   Other Itching, Rash, Other (See Comments)   monsel solution caused rash, itching   Penicillins Rash   Did it involve swelling of the face/tongue/throat, SOB, or low BP? No Did it involve sudden or severe rash/hives, skin peeling, or any reaction on the inside of your mouth or nose? Yes Did you need to seek medical attention at a hospital or doctor's office? No When did it last happen?approx 69 years old If all above answers are "NO", may proceed with cephalosporin use.   Simponi [golimumab] Hives, Itching, Rash   Zetia [ezetimibe] Rash, Other (See Comments), Cough   Headache, itchy rash, fatigue, dry mouth, dry cough      Medication List    TAKE these medications   acetaminophen 500 MG tablet Commonly known as: TYLENOL Take 1,000 mg by mouth daily as needed (pain).   acetaminophen 650 MG CR tablet Commonly known as: TYLENOL Take 1,300 mg by mouth at bedtime.   allopurinol 100 MG tablet Commonly known as: ZYLOPRIM Take 100 mg by mouth daily with supper.   colchicine 0.6 MG tablet Take 0.6 mg by mouth daily with lunch.   Cran-Max 500 MG Caps Generic drug: Cranberry Take 500 mg by mouth daily after breakfast.   cyclobenzaprine 10 MG tablet Commonly known as: FLEXERIL Take 10 mg by mouth See admin instructions. Take one tablet (10 mg) by mouth daily at bedtime, may also take one tablet (10 mg) daily as needed for pain   diphenhydrAMINE 12.5 MG/5ML liquid Commonly known as: BENADRYL Take 12.5 mg by mouth daily as needed for itching or allergies.   EpiPen 2-Pak 0.3 mg/0.3 mL Soaj injection Generic drug: EPINEPHrine Inject 0.3 mg into the muscle once as needed for anaphylaxis (severe allergic  reaction).   levothyroxine 112 MCG tablet Commonly known as: SYNTHROID Take 112 mcg by mouth daily before breakfast.   PROBIOTIC PO Take 1 tablet by mouth 3 (three) times a week.   REFRESH OP Place 1 drop into both eyes daily as needed (itching/ dry eyes).   traMADol 50 MG tablet Commonly known as: ULTRAM Take 50 mg by mouth every 8 (eight) hours as needed (pain).   TURMERIC PO Take 1,500 mg by mouth daily.   Vitamin D-3 125 MCG (5000 UT) Tabs Take 5,000 Units by mouth at bedtime.            Durable Medical Equipment  (From admission, onward)         Start     Ordered   11/02/19 1422  For home use only DME 3 n 1  Once     11/02/19 1421   11/02/19 1420  For home use only DME lightweight manual wheelchair with seat cushion  Once    Comments: Patient suffers from Cervical  Spine spondylosis with disc protrusion contacting C6 nerve root and severe bilateral neuroforaminal narrowing/moderate central canal stenosis which impairs their ability to perform daily activities like ambulating  in the home.  A cane will not resolve  issue with performing activities of daily living. A wheelchair will allow patient to safely perform daily activities. Patient is not able to propel themselves in the home using a standard weight wheelchair due to Cervical Spine spondylosis with disc protrusion contacting C6 nerve root and severe bilateral neuroforaminal narrowing/moderate central canal stenosis. Patient can self propel in the lightweight wheelchair. Length of need life time . Accessories: elevating leg rests (ELRs), wheel locks, extensions and anti-tippers.  Seat and back cushions   11/02/19 1421   11/02/19 1359  For home use only DME Tub bench  Once     11/02/19 1358   11/02/19 1359  For home use only DME Shower stool  Once     11/02/19 1358   11/02/19 1358  For home use only DME Bedside commode  Once    Question:  Patient needs a bedside commode to treat with the following condition   Answer:  Ataxia   11/02/19 1358             Durable Medical Equipment  (From admission, onward)         Start     Ordered   11/02/19 1422  For home use only DME 3 n 1  Once     11/02/19 1421   11/02/19 1420  For home use only DME lightweight manual wheelchair with seat cushion  Once    Comments: Patient suffers from Cervical Spine spondylosis with disc protrusion contacting C6 nerve root and severe bilateral neuroforaminal narrowing/moderate central canal stenosis which impairs their ability to perform daily activities like ambulating  in the home.  A cane will not resolve  issue with performing activities of daily living. A wheelchair will allow patient to safely perform daily activities. Patient is not able to propel themselves in the home using a standard weight wheelchair due to Cervical Spine spondylosis with disc protrusion contacting C6 nerve root and severe bilateral neuroforaminal narrowing/moderate central canal stenosis. Patient can self propel in the lightweight wheelchair. Length of need life time . Accessories: elevating leg rests (ELRs), wheel locks, extensions and anti-tippers.  Seat and back cushions   11/02/19 1421   11/02/19 1359  For home use only DME Tub bench  Once     11/02/19 1358   11/02/19 1359  For home use only DME Shower stool  Once     11/02/19 1358   11/02/19 1358  For home use only DME Bedside commode  Once    Question:  Patient needs a bedside commode to treat with the following condition  Answer:  Ataxia   11/02/19 1358                Durable Medical Equipment  (From admission, onward)         Start     Ordered   11/02/19 1422  For home use only DME 3 n 1  Once     11/02/19 1421   11/02/19 1420  For home use only DME lightweight manual wheelchair with seat cushion  Once    Comments: Patient suffers from Cervical Spine spondylosis with disc protrusion contacting C6 nerve root and severe bilateral neuroforaminal narrowing/moderate  central canal stenosis which impairs their ability to perform daily activities like ambulating  in the home.  A  cane will not resolve  issue with performing activities of daily living. A wheelchair will allow patient to safely perform daily activities. Patient is not able to propel themselves in the home using a standard weight wheelchair due to Cervical Spine spondylosis with disc protrusion contacting C6 nerve root and severe bilateral neuroforaminal narrowing/moderate central canal stenosis. Patient can self propel in the lightweight wheelchair. Length of need life time . Accessories: elevating leg rests (ELRs), wheel locks, extensions and anti-tippers.  Seat and back cushions   11/02/19 1421   11/02/19 1359  For home use only DME Tub bench  Once     11/02/19 1358   11/02/19 1359  For home use only DME Shower stool  Once     11/02/19 1358   11/02/19 1358  For home use only DME Bedside commode  Once    Question:  Patient needs a bedside commode to treat with the following condition  Answer:  Ataxia   11/02/19 1358              Durable Medical Equipment  (From admission, onward)         Start     Ordered   11/02/19 1359  For home use only DME Tub bench  Once     11/02/19 1358   11/02/19 1359  For home use only DME Shower stool  Once     11/02/19 1358   11/02/19 1358  For home use only DME Bedside commode  Once    Question:  Patient needs a bedside commode to treat with the following condition  Answer:  Ataxia   11/02/19 1358         Follow-up Information    Lisbeth Renshaw, MD. Schedule an appointment as soon as possible for a visit.   Specialty: Neurosurgery Contact information: [address] [place] [address] [phone]          Allergies  Allergen Reactions  . Ceclor [Cefaclor] Shortness Of Breath, Itching and Rash  . Metformin And Related Shortness Of Breath, Rash and Other (See Comments)    Fatigue, scalp rash, shakiness  .  Voltaren [Diclofenac] Shortness Of Breath and Swelling    Reaction to gel - throat swelling  . Morphine And Related Nausea And Vomiting    Projectile vomiting  . Amlodipine Swelling and Other (See Comments)    Minor shortness of breath, fatigue, headache,   . Arava [Leflunomide] Nausea And Vomiting and Other (See Comments)    Chest pain, tachycardia, elevated bp  . Atorvastatin Other (See Comments)    Severe joint pain and abdominal pain  . Boswellia Hives  . Cephalosporins Other (See Comments)    Unknown reaction  . Clindamycin/Lincomycin Diarrhea    Severe diarrhea  . Gemfibrozil Itching, Nausea And Vomiting and Other (See Comments)    Muscle pain, hot flashes  . Lisinopril Cough  . Lodine [Etodolac] Other (See Comments)    Stomach and abdominal pain, mouth ulcers  . Medrol [Methylprednisolone] Other (See Comments)    Stomach pain/ tachycardia  . Methotrexate Derivatives Other (See Comments)    Fatigue/ GI symptoms/ headache/ muscle pain/ face and scalp rash, mouth ulcers  . Niacin And Related Other (See Comments)    Flushing, heartburn, insomnia, joint pain, elevated heart rate  . Plaquenil [Hydroxychloroquine] Itching and Other (See Comments)    Blisters on scalp   . Pravastatin Other (See Comments)    Muscle pain  . Rayos [Prednisone] Other (See Comments)  Stomach pain, tachycardia, muscle spasms, parathesia  . Sulfa Antibiotics Other (See Comments)    Unknown reaction  . Unasyn [Ampicillin-Sulbactam Sodium] Itching and Other (See Comments)    Blisters on scalp   . Zocor [Simvastatin] Diarrhea and Other (See Comments)    Abdominal pain,   . Actemra [Tocilizumab] Rash and Other (See Comments)    Facial rash, rash at injection site, rectal bleeding, headache  . Baclofen Rash    Petechial face rash  . Enbrel [Etanercept] Rash and Other (See Comments)    Redness, swelling at injection site, tightness in throat w/ mild respiratory distress, muscle spasms, bell's palsy,  discoid lupus  . Ibuprofen Swelling and Rash    Swollen face  . Other Itching, Rash and Other (See Comments)    monsel solution caused rash, itching  . Penicillins Rash    Did it involve swelling of the face/tongue/throat, SOB, or low BP? No Did it involve sudden or severe rash/hives, skin peeling, or any reaction on the inside of your mouth or nose? Yes Did you need to seek medical attention at a hospital or doctor's office? No When did it last happen?approx 69 years old If all above answers are "NO", may proceed with cephalosporin use.  . Simponi [Golimumab] Hives, Itching and Rash  . Zetia [Ezetimibe] Rash, Other (See Comments) and Cough    Headache, itchy rash, fatigue, dry mouth, dry cough    The results of significant diagnostics from this hospitalization (including imaging, microbiology, ancillary and laboratory) are listed below for reference.     Microbiology: Recent Results (from the past 240 hour(s))  SARS CORONAVIRUS 2 (TAT 6-24 HRS) Nasopharyngeal Nasopharyngeal Swab     Status: None   Collection Time: 10/30/19  9:39 PM   Specimen: Nasopharyngeal Swab  Result Value Ref Range Status   SARS Coronavirus 2 NEGATIVE NEGATIVE Final    Comment: (NOTE) SARS-CoV-2 target nucleic acids are NOT DETECTED. The SARS-CoV-2 RNA is generally detectable in upper and lower respiratory specimens during the acute phase of infection. Negative results do not preclude SARS-CoV-2 infection, do not rule out co-infections with other pathogens, and should not be used as the sole basis for treatment or other patient management decisions. Negative results must be combined with clinical observations, patient history, and epidemiological information. The expected result is Negative. Fact Sheet for Patients: HairSlick.no Fact Sheet for Healthcare Providers: quierodirigir.com This test is not yet approved or cleared by the [place]  FDA and  has been authorized for detection and/or diagnosis of SARS-CoV-2 by FDA under an Emergency Use Authorization (EUA). This EUA will remain  in effect (meaning this test can be used) for the duration of the COVID-19 declaration under Section 56 4(b)(1) of the Act, 21 U.S.C. section 360bbb-3(b)(1), unless the authorization is terminated or revoked sooner. Performed at [facility], [address] [place], [place] [address]      Labs: BNP (last 3 results) No results for input(s): BNP in the last 8760 hours. Basic Metabolic Panel: Recent Labs  Lab 10/30/19 1415 10/30/19 1421 11/02/19 0208  NA 135 136 137  K 3.7 3.9 3.8  CL 95* 98 100  CO2 27  --  24  GLUCOSE 139* 134* 142*  BUN CREATININE 0.72 0.70 0.90  CALCIUM 9.7  --  9.7   Liver Function Tests: Recent Labs  Lab 10/30/19 1415  AST 21  ALT 26  ALKPHOS 75  BILITOT 0.8  PROT 8.0  ALBUMIN 4.2  No results for input(s): LIPASE, AMYLASE in the last 168 hours. No results for input(s): AMMONIA in the last 168 hours. CBC: Recent Labs  Lab 10/30/19 1415 10/30/19 1421  WBC 9.1  --   NEUTROABS 6.8  --   HGB 14.6 15.3*  HCT 44.8 45.0  MCV 88.2  --   PLT 235  --    Cardiac Enzymes: No results for input(s): CKTOTAL, CKMB, CKMBINDEX, TROPONINI in the last 168 hours. BNP: Invalid input(s): POCBNP CBG: Recent Labs  Lab 10/30/19 1252  GLUCAP 128*   D-Dimer No results for input(s): DDIMER in the last 72 hours. Hgb A1c No results for input(s): HGBA1C in the last 72 hours. Lipid Profile No results for input(s): CHOL, HDL, LDLCALC, TRIG, CHOLHDL, LDLDIRECT in the last 72 hours. Thyroid function studies No results for input(s): TSH, T4TOTAL, T3FREE, THYROIDAB in the last 72 hours.  Invalid input(s): FREET3 Anemia work up No results for input(s): VITAMINB12, FOLATE, FERRITIN, TIBC, IRON, RETICCTPCT in the last 72 hours. Urinalysis    Component Value Date/Time   COLORURINE YELLOW 10/30/2019  1350   APPEARANCEUR HAZY (A) 10/30/2019 1350   LABSPEC 1.016 10/30/2019 1350   PHURINE 5.0 10/30/2019 1350   GLUCOSEU NEGATIVE 10/30/2019 1350   HGBUR SMALL (A) 10/30/2019 1350   BILIRUBINUR NEGATIVE 10/30/2019 1350   KETONESUR NEGATIVE 10/30/2019 1350   PROTEINUR 30 (A) 10/30/2019 1350   NITRITE POSITIVE (A) 10/30/2019 1350   LEUKOCYTESUR TRACE (A) 10/30/2019 1350   Sepsis Labs Invalid input(s): PROCALCITONIN,  WBC,  LACTICIDVEN Microbiology Recent Results (from the past 240 hour(s))  SARS CORONAVIRUS 2 (TAT 6-24 HRS) Nasopharyngeal Nasopharyngeal Swab     Status: None   Collection Time: 10/30/19  9:39 PM   Specimen: Nasopharyngeal Swab  Result Value Ref Range Status   SARS Coronavirus 2 NEGATIVE NEGATIVE Final    Comment: (NOTE) SARS-CoV-2 target nucleic acids are NOT DETECTED. The SARS-CoV-2 RNA is generally detectable in upper and lower respiratory specimens during the acute phase of infection. Negative results do not preclude SARS-CoV-2 infection, do not rule out co-infections with other pathogens, and should not be used as the sole basis for treatment or other patient management decisions. Negative results must be combined with clinical observations, patient history, and epidemiological information. The expected result is Negative. Fact Sheet for Patients: HairSlick.no Fact Sheet for Healthcare Providers: quierodirigir.com This test is not yet approved or cleared by the [place] FDA and  has been authorized for detection and/or diagnosis of SARS-CoV-2 by FDA under an Emergency Use Authorization (EUA). This EUA will remain  in effect (meaning this test can be used) for the duration of the COVID-19 declaration under Section 56 4(b)(1) of the Act, 21 U.S.C. section 360bbb-3(b)(1), unless the authorization is terminated or revoked sooner. Performed at [facility], [address], [place], [place] [address]       Time coordinating discharge: 36 minutes  SIGNED:   Loistine Chance, MD  [facility] 11/03/2019, 4:18 PM

## 2020-03-10 ENCOUNTER — Other Ambulatory Visit: Payer: Self-pay

## 2020-03-10 ENCOUNTER — Ambulatory Visit (INDEPENDENT_AMBULATORY_CARE_PROVIDER_SITE_OTHER): Payer: No Typology Code available for payment source | Admitting: Allergy

## 2020-03-10 ENCOUNTER — Encounter: Payer: Self-pay | Admitting: Allergy

## 2020-03-10 VITALS — BP 136/60 | HR 101 | Temp 97.4°F | Resp 18 | Ht 59.5 in | Wt 188.8 lb

## 2020-03-10 DIAGNOSIS — T50905D Adverse effect of unspecified drugs, medicaments and biological substances, subsequent encounter: Secondary | ICD-10-CM

## 2020-03-10 DIAGNOSIS — L509 Urticaria, unspecified: Secondary | ICD-10-CM | POA: Diagnosis not present

## 2020-03-10 DIAGNOSIS — T783XXD Angioneurotic edema, subsequent encounter: Secondary | ICD-10-CM

## 2020-03-10 DIAGNOSIS — J4520 Mild intermittent asthma, uncomplicated: Secondary | ICD-10-CM | POA: Diagnosis not present

## 2020-03-10 MED ORDER — ALBUTEROL SULFATE HFA 108 (90 BASE) MCG/ACT IN AERS: INHALATION_SPRAY | RESPIRATORY_TRACT | 1 refills | Status: AC

## 2020-03-10 NOTE — Progress Notes (Signed)
New Patient Note  RE: Kristy Gill MRN: [id number] DOB: 29-Jun-1951 Date of Office Visit: 03/10/2020  Referring provider: [facility] Primary care provider: [facility]  Chief Complaint: slow metabolism  History of present illness: Kristy Gill is a 69 y.o. female presenting today for consultation for allergies.   She states about 6 months ago she states a doctor at the [place] told her she needed her immune system checked due to inability to lose weight.  She states she tried intermittent fasting for about 5 months and did not lose any weight.  She states her metabolism is slow and wants to get this evaluated.  However discussed that this is not an issue but falls in my expertise and I do not have any testing available to evaluate her metabolism. What I can offer her is an evaluation from an allergy standpoint.  She states she had allergy testing about 30 years ago and denies having any significant seasonal or perennial symptoms suggestive of allergic rhinitis with conjunctivitis.  She has no history of asthma.  However she states that when she is exposed to strong odors like if she is in a store and some walks by with a strong cologne or perfume she will start to feel like she is having difficulty breathing like her throat is a bit tight.  She was prescribed an EpiPen for this but she has not needed to use it.  She also has a long medication allergy list.  She states greater than 20 years ago she had a reaction to Ceclor where she developed wheezing, shortness of breath, and itchy rash.  She states the symptoms started maybe a day or so into the course.  She states her initial medication reaction was to penicillin likely as a child.  She has been avoiding penicillins and cephalosporins.  She states she had a reaction to Unasyn where she developed an itch and blisters on her scalp again this occurred several days into the course.  She states with Plaquenil she developed  itching and blisters on the scalp but occurred several days in.  With ibuprofen she states she had an immediate hive-like rash, itch and facial swelling.  She avoids all NSAIDs.  With baclofen she states she developed a petechial rash on her face again several days into the course.  With Actemra she reports having a facial rash and a rash at the injection site as well as rectal bleeding and headache again next several days and to use.  With Simponi she reports developing hive-like rash and itch again several days into use.  With amlodipine she reports having generalized edema with shortness of breath, fatigue and headache.  Voltaren gel she reports she developed difficulty breathing after topical application and this resolved after she took Benadryl and washed off the gel.  With niacin she reports developing flushing, heartburn, increased heart rate, insomnia and joint pain.  With lodine she reports developing abdominal pain and mouth ulcers.  Zetia she reports having headache, and itchy rash, fatigue, dry mouth and a cough after starting.  With Zocor she developed abdominal pain and diarrhea shortly after use.  With Enbrel she reports developing redness, swelling at the injection site, tightness in her throat with difficulty breathing, fevers and chills muscle spasms, Bell's palsy and discoid lupus.  Clindamycin caused severe diarrhea.  Methotrexate she reports developing fatigue, GI symptoms, headache, muscle pain, face and scalp rash and mouth ulcers.  With Areva she reports developing nausea,  chest pain, tachycardia, elevated blood pressure, muscle pain and a reoccurring rash.  With Metformin she reports developing fatigue, shortness of breath, shakiness and a scalp rash.  With morphine she reports developing projectile vomiting and a rash. With Boswellia (she states she got this from her vet and it is an anti-inflammatory) and after use she developed generalized hives.  She also reports having medication  sensitivities and states that pravastatin caused muscle pain.  Atorvastatin at higher dose caused severe joint pain, abdominal pain however she reports similar symptoms at lower dose.  Gemfibrozil caused muscle pain, nausea, itching and hot flashes.  Rayos caused stomach pain, tachycardia, muscle spasms and paresthesia.   Adhesive causes rash and skin peeling.    Review of systems: Review of Systems  Constitutional: Negative.   HENT: Negative.   Eyes: Negative.   Respiratory: Negative.   Cardiovascular: Negative.   Gastrointestinal: Negative.   Musculoskeletal: Negative.   Skin: Negative.   Neurological: Negative.     All other systems negative unless noted above in HPI  Past medical history: Past Medical History:  Diagnosis Date  . Arthritis   . Breast disorder   . Clawfoot, acquired   . Diabetes mellitus without complication ([place])   . Gall stones   . Gout   . Hypercalcemia   . Hyperlipidemia   . Hypertension   . Hypertensive heart disease without heart failure   . Obesity   . Osteoporosis   . Pancreatitis   . Primary osteoarthritis   . Rotator cuff rupture   . Thyroid disease    hypothryoidism    Past surgical history: Past Surgical History:  Procedure Laterality Date  . hand reconstruction Right 10/26/2019  . JOINT REPLACEMENT Right 2017   Right index joint replacement with right thumb fusion  . 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 Rotator cuff surgery     Bilateral bicep tenotomy with rotator cuff surgeries in 2010, 2011  . Thumb joint fusion Left 07/2015    Family history:  History reviewed. No pertinent family history.  Social history: Lives in a home with out carpeting with gas heating and central cooling.  Cats in the home.  No concern for water damage, mildew or roaches.  She is a retired Marine scientist.  Denies a smoking history.  Medication List: Current Outpatient Medications  Medication Sig Dispense Refill  . acetaminophen (TYLENOL) 500 MG tablet  Take 1,000 mg by mouth daily as needed (pain).    Marland Kitchen acetaminophen (TYLENOL) 650 MG CR tablet Take 1,300 mg by mouth at bedtime.    Marland Kitchen allopurinol (ZYLOPRIM) 100 MG tablet Take 100 mg by mouth daily with supper.    . Cholecalciferol (VITAMIN D-3) 125 MCG (5000 UT) TABS Take 5,000 Units by mouth at bedtime.    . colchicine 0.6 MG tablet Take 0.6 mg by mouth daily with lunch.    . Cranberry (CRAN-MAX) 500 MG CAPS Take 500 mg by mouth daily after breakfast.    . cyclobenzaprine (FLEXERIL) 10 MG tablet Take 10 mg by mouth See admin instructions. Take one tablet (10 mg) by mouth daily at bedtime, may also take one tablet (10 mg) daily as needed for pain    . diphenhydrAMINE (BENADRYL) 12.5 MG/5ML liquid Take 12.5 mg by mouth daily as needed for itching or allergies.    Marland Kitchen EPINEPHrine (EPIPEN 2-PAK) 0.3 mg/0.3 mL IJ SOAJ injection Inject 0.3 mg into the muscle once as needed for anaphylaxis (severe allergic reaction).    Marland Kitchen levothyroxine (SYNTHROID)  112 MCG tablet Take 112 mcg by mouth daily before breakfast.    . losartan (COZAAR) 25 MG tablet losartan 25 mg tablet  Take 1 tablet every day by oral route.    . Polyvinyl Alcohol-Povidone (REFRESH OP) Place 1 drop into both eyes daily as needed (itching/ dry eyes).    . Probiotic Product (PROBIOTIC PO) Take 1 tablet by mouth 3 (three) times a week.    . traMADol (ULTRAM) 50 MG tablet Take 50 mg by mouth every 8 (eight) hours as needed (pain).    . TURMERIC PO Take 1,500 mg by mouth daily.    Marland Kitchen albuterol (VENTOLIN HFA) 108 (90 Base) MCG/ACT inhaler Inhale 2 puffs into the lungs every 4-6 hours as needed for cough, wheeze, shortness of breath or chest tightness. 18 g 1   No current facility-administered medications for this visit.    Known medication allergies: Allergies  Allergen Reactions  . Ceclor [Cefaclor] Shortness Of Breath, Itching and Rash  . Metformin And Related Shortness Of Breath, Rash and Other (See Comments)    Fatigue, scalp rash,  shakiness  . Voltaren [Diclofenac] Shortness Of Breath and Swelling    Reaction to gel - throat swelling  . Morphine And Related Nausea And Vomiting    Projectile vomiting  . Amlodipine Swelling and Other (See Comments)    Minor shortness of breath, fatigue, headache,   . Arava [Leflunomide] Nausea And Vomiting and Other (See Comments)    Chest pain, tachycardia, elevated bp  . Atorvastatin Other (See Comments)    Severe joint pain and abdominal pain  . Boswellia Hives  . Cephalosporins Other (See Comments)    Unknown reaction  . Clindamycin/Lincomycin Diarrhea    Severe diarrhea  . Gemfibrozil Itching, Nausea And Vomiting and Other (See Comments)    Muscle pain, hot flashes  . Lisinopril Cough  . Lodine [Etodolac] Other (See Comments)    Stomach and abdominal pain, mouth ulcers  . Medrol [Methylprednisolone] Other (See Comments)    Stomach pain/ tachycardia  . Methotrexate Derivatives Other (See Comments)    Fatigue/ GI symptoms/ headache/ muscle pain/ face and scalp rash, mouth ulcers  . Niacin And Related Other (See Comments)    Flushing, heartburn, insomnia, joint pain, elevated heart rate  . Plaquenil [Hydroxychloroquine] Itching and Other (See Comments)    Blisters on scalp   . Pravastatin Other (See Comments)    Muscle pain  . Rayos [Prednisone] Other (See Comments)    Stomach pain, tachycardia, muscle spasms, parathesia  . Sulfa Antibiotics Other (See Comments)    Unknown reaction  . Unasyn [Ampicillin-Sulbactam Sodium] Itching and Other (See Comments)    Blisters on scalp   . Zocor [Simvastatin] Diarrhea and Other (See Comments)    Abdominal pain,   . Actemra [Tocilizumab] Rash and Other (See Comments)    Facial rash, rash at injection site, rectal bleeding, headache  . Baclofen Rash    Petechial face rash  . Enbrel [Etanercept] Rash and Other (See Comments)    Redness, swelling at injection site, tightness in throat w/ mild respiratory distress, muscle spasms,  bell's palsy, discoid lupus  . Ibuprofen Swelling and Rash    Swollen face  . Other Itching, Rash and Other (See Comments)    monsel solution caused rash, itching  . Penicillins Rash    Did it involve swelling of the face/tongue/throat, SOB, or low BP? No Did it involve sudden or severe rash/hives, skin peeling, or any reaction on the inside of  your mouth or nose? Yes Did you need to seek medical attention at a hospital or doctor's office? No When did it last happen?approx 69 years old If all above answers are "NO", may proceed with cephalosporin use.  . Simponi [Golimumab] Hives, Itching and Rash  . Zetia [Ezetimibe] Rash, Other (See Comments) and Cough    Headache, itchy rash, fatigue, dry mouth, dry cough     Physical examination: Blood pressure 136/60, pulse (!) 101, temperature (!) 97.4 F (36.3 C), temperature source Temporal, resp. rate 18, height 4' 11.5" (1.511 m), weight 188 lb 12.8 oz (85.6 kg), SpO2 98 %.  General: Alert, interactive, in no acute distress. HEENT: PERRLA, TMs pearly gray, turbinates non-edematous without discharge, post-pharynx non erythematous. Neck: Supple without lymphadenopathy. Lungs: Clear to auscultation without wheezing, rhonchi or rales. {no increased work of breathing. CV: Normal S1, S2 without murmurs. Abdomen: Nondistended, nontender. Skin: Warm and dry, without lesions or rashes. Extremities:  No clubbing, cyanosis or edema. Neuro:   Grossly intact.  Diagnositics/Labs: none today   Assessment and plan: Adverse reactions to medications -she has a vast list of medication reactions.  I discussed with her that the only standardized testing that we have to a medication is for penicillins.  I do believe that we can eliminate penicillin allergy from her list with testing.  She has had more than a decade past between her reported reactions to penicillins and thus at this point in time it is very favorable that she is not allergic.  She will go  ahead and schedule for penicillin testing with likely challenge. It is going to be hard to fully decipher the rest of her medication issues as many of them do not appear to be true IgE mediated allergies as she reports that most of her reactions develop a couple of days into taking the medication which would not be very consistent with an IgE mediated reaction.  It is also very possible that many of her listed medication reactions are indeed just adverse events/side effects related to the medications.  Once we are able to look further into penicillin allergy we will be able to dive a little bit deeper into the rest of her list.  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  This also could represent vocal cord dysfunction.  I do not have access to an albuterol inhaler to use when she is exposed to strong odors and is symptomatic to see if this helps to resolve the symptoms.  She also has access to an epinephrine device.  Urticaria and angioedema -many of her reported symptoms of these medication reactions involve urticaria and angioedema.  I discussed with her today that maybe she has an underlying issue with urticaria and angioedema and that may be the medications are coincidental in timing with the episodes of urticaria and angioedema.  Will do an evaluation at this time for urticaria and angioedema.    - continue current medication avoidance for now - discussed performing testing for penicillins as likely are not allergic anymore.  With testing will need to avoid antihistamines for 3 days prior to testing.  Testing includes skin prick testing, intradermal testing and an oral challenge.   - will obtain lab work to assess for hives/swelling/recurrent reactions to see if there is an underlying reason for your reaction  - continue to have access to your epinephrine device  - would recommend having access  to an albuterol inhaler  to use  2 puffs every 4-6 hours as needed for cough/wheeze/shortness of breath/chest tightness.  Believe you have an reactive airway that is triggered by irritant strong odors  - can continue your use of benadryl as needed  Follow-up for PCN testing  I appreciate the opportunity to take part in Kristy Gill's care. Please do not hesitate to contact me with questions.  Sincerely,   Margo Aye, MD Allergy/Immunology [facility]

## 2020-03-10 NOTE — Patient Instructions (Signed)
-   continue current medication avoidance for now - discussed performing testing for penicillins as likely are not allergic anymore.  With testing will need to avoid antihistamines for 3 days prior to testing.  Testing includes skin prick testing, intradermal testing and an oral challenge.   - will obtain lab work to assess for hives/swelling/recurrent reactions to see if there is an underlying reason for your reaction  - continue to have access to your epinephrine device  - would recommend having access to an albuterol inhaler to use  2 puffs every 4-6 hours as needed for cough/wheeze/shortness of breath/chest tightness.  Believe you have an reactive airway that is triggered by irritant strong odors  - can continue your use of benadryl as needed  Follow-up for PCN testing

## 2020-03-11 ENCOUNTER — Encounter: Payer: Self-pay | Admitting: Allergy

## 2020-03-17 LAB — ALPHA-GAL PANEL
Alpha Gal IgE*: <0.1 kU/L (ref <0.10)
Beef (Bos spp) IgE: <0.1 kU/L (ref <0.35)
Class Interpretation: 0
Class Interpretation: 0
Class Interpretation: 0
Lamb/Mutton (Ovis spp) IgE: <0.1 kU/L (ref <0.35)
Pork (Sus spp) IgE: <0.1 kU/L (ref <0.35)

## 2020-03-17 LAB — ALLERGENS W/TOTAL IGE AREA 2
Alternaria Alternata IgE: <0.1 kU/L
Aspergillus Fumigatus IgE: <0.1 kU/L
Bermuda Grass IgE: <0.1 kU/L
Cat Dander IgE: 2.93 kU/L — AB
Cedar, Mountain IgE: <0.1 kU/L
Cladosporium Herbarum IgE: <0.1 kU/L
Cockroach, German IgE: <0.1 kU/L
Common Silver Birch IgE: <0.1 kU/L
Cottonwood IgE: <0.1 kU/L
D Farinae IgE: <0.1 kU/L
D Pteronyssinus IgE: <0.1 kU/L
Dog Dander IgE: 0.16 kU/L — AB
Elm, American IgE: <0.1 kU/L
IgE (Immunoglobulin E), Serum: 213 IU/mL (ref 6–495)
Johnson Grass IgE: <0.1 kU/L
Maple/Box Elder IgE: <0.1 kU/L
Mouse Urine IgE: <0.1 kU/L
Oak, White IgE: <0.1 kU/L
Pecan, Hickory IgE: <0.1 kU/L
Penicillium Chrysogen IgE: <0.1 kU/L
Pigweed, Rough IgE: <0.1 kU/L
Ragweed, Short IgE: <0.1 kU/L
Sheep Sorrel IgE Qn: <0.1 kU/L
Timothy Grass IgE: <0.1 kU/L
White Mulberry IgE: <0.1 kU/L

## 2020-03-17 LAB — CBC WITH DIFFERENTIAL
Basophils Absolute: 0.1 10*3/uL (ref 0.0–0.2)
Basos: 1 %
EOS (ABSOLUTE): 0.2 10*3/uL (ref 0.0–0.4)
Eos: 2 %
Hematocrit: 41.4 % (ref 34.0–46.6)
Hemoglobin: 13.7 g/dL (ref 11.1–15.9)
Immature Grans (Abs): 0 10*3/uL (ref 0.0–0.1)
Immature Granulocytes: 0 %
Lymphocytes Absolute: 2 10*3/uL (ref 0.7–3.1)
Lymphs: 24 %
MCH: 29.2 pg (ref 26.6–33.0)
MCHC: 33.1 g/dL (ref 31.5–35.7)
MCV: 88 fL (ref 79–97)
Monocytes Absolute: 0.6 10*3/uL (ref 0.1–0.9)
Monocytes: 7 %
Neutrophils Absolute: 5.4 10*3/uL (ref 1.4–7.0)
Neutrophils: 66 %
RBC: 4.69 x10E6/uL (ref 3.77–5.28)
RDW: 13.7 % (ref 11.7–15.4)
WBC: 8.2 10*3/uL (ref 3.4–10.8)

## 2020-03-17 LAB — COMPREHENSIVE METABOLIC PANEL
ALT: 27 IU/L (ref 0–32)
AST: 25 IU/L (ref 0–40)
Albumin/Globulin Ratio: 1.5 (ref 1.2–2.2)
Albumin: 4.5 g/dL (ref 3.8–4.8)
Alkaline Phosphatase: 88 IU/L (ref 48–121)
BUN/Creatinine Ratio: 27 (ref 12–28)
BUN: 22 mg/dL (ref 8–27)
Bilirubin Total: 0.2 mg/dL (ref 0.0–1.2)
CO2: 22 mmol/L (ref 20–29)
Calcium: 10.1 mg/dL (ref 8.7–10.3)
Chloride: 100 mmol/L (ref 96–106)
Creatinine, Ser: 0.82 mg/dL (ref 0.57–1.00)
GFR calc Af Amer: 85 mL/min/{1.73_m2} (ref >59)
GFR calc non Af Amer: 74 mL/min/{1.73_m2} (ref >59)
Globulin, Total: 3.1 g/dL (ref 1.5–4.5)
Glucose: 140 mg/dL — ABNORMAL HIGH (ref 65–99)
Potassium: 4.7 mmol/L (ref 3.5–5.2)
Sodium: 136 mmol/L (ref 134–144)
Total Protein: 7.6 g/dL (ref 6.0–8.5)

## 2020-03-17 LAB — THYROID ANTIBODIES
Thyroglobulin Antibody: <1 IU/mL (ref 0.0–0.9)
Thyroperoxidase Ab SerPl-aCnc: 10 IU/mL (ref 0–34)

## 2020-03-17 LAB — TRYPTASE: Tryptase: 7 ug/L (ref 2.2–13.2)

## 2020-03-17 LAB — CHRONIC URTICARIA: cu index: >50 — ABNORMAL HIGH (ref <10)

## 2020-04-28 ENCOUNTER — Encounter: Payer: Self-pay | Admitting: Allergy

## 2020-04-28 ENCOUNTER — Telehealth: Payer: Self-pay

## 2020-04-28 ENCOUNTER — Ambulatory Visit (INDEPENDENT_AMBULATORY_CARE_PROVIDER_SITE_OTHER): Payer: No Typology Code available for payment source | Admitting: Allergy

## 2020-04-28 ENCOUNTER — Other Ambulatory Visit: Payer: Self-pay

## 2020-04-28 VITALS — BP 130/78 | HR 102 | Resp 18

## 2020-04-28 DIAGNOSIS — R21 Rash and other nonspecific skin eruption: Secondary | ICD-10-CM

## 2020-04-28 DIAGNOSIS — R062 Wheezing: Secondary | ICD-10-CM | POA: Diagnosis not present

## 2020-04-28 DIAGNOSIS — L299 Pruritus, unspecified: Secondary | ICD-10-CM

## 2020-04-28 DIAGNOSIS — T50905D Adverse effect of unspecified drugs, medicaments and biological substances, subsequent encounter: Secondary | ICD-10-CM

## 2020-04-28 DIAGNOSIS — W19XXXA Unspecified fall, initial encounter: Secondary | ICD-10-CM

## 2020-04-28 MED ORDER — EPINEPHRINE PF 1 MG/ML IJ SOLN
0.3000 mg | Freq: Once | INTRAMUSCULAR | Status: AC
  Administered 2020-04-28: 0.3 mg via INTRAMUSCULAR

## 2020-04-28 NOTE — Telephone Encounter (Signed)
Follow up call after reaction during penicillin challenge. Patient is stable and doing well. She did have some residual shakiness, but she attributes that to the epi given this morning. She did take an extra teaspoon of Benadryl when she got home. I did let her know that she could reach out if she needed anything else. Provider aware.

## 2020-04-28 NOTE — Patient Instructions (Signed)
Drug allergy -Penicillin skin testing was negative however oral challenge was not passed.  Due to onset of symptoms which necessitated treatment she is deemed still penicillin allergic and needs to continue to avoid penicillin based antibiotics. -She should continue her current drug allergy avoidance at this time.  Fall -Upon coming into the office she did have a fall where she per report landed on her left knee and hyperextended her left pinky finger.  She does have some overlying erythema that likely is turning into bruise of the medial portion of her pinky.  I did advised that she immobilize the pinky and if swelling and or pain or range of motion worsens that she likely should have the hand imaged.  Advised that she had discussed with her primary care in regards to this.

## 2020-04-28 NOTE — Progress Notes (Signed)
Follow-up Note  RE: Kristy Gill MRN: [id number] DOB: 12-13-1950 Date of Office Visit: 04/28/2020   History of present illness: Kristy Gill is a 69 y.o. female presenting today for penicillin testing and challenge.  She was last seen in the office on Mar 10, 2020 by myself.  She has an extensive drug allergy history.  Her penicillin allergy history is from childhood.  Please refer to initial visit note for full details.  She is in her normal state of health today however she did have a fall coming into the building and has soreness and pain of her right pinky.  She has not had any antihistamines for the challenge today.  Review of systems: Review of Systems  Constitutional: Negative.   HENT: Negative.   Eyes: Negative.   Respiratory: Negative.   Cardiovascular: Negative.   Gastrointestinal: Negative.   Genitourinary: Negative.   Musculoskeletal: Positive for falls and joint pain.  Skin: Negative.   Neurological: Negative.     All other systems negative unless noted above in HPI  Past medical/social/surgical/family history have been reviewed and are unchanged unless specifically indicated below.  No changes  Medication List: Current Outpatient Medications  Medication Sig Dispense Refill  . acetaminophen (TYLENOL) 500 MG tablet Take 1,000 mg by mouth daily as needed (pain).    Marland Kitchen acetaminophen (TYLENOL) 650 MG CR tablet Take 1,300 mg by mouth at bedtime.    Marland Kitchen albuterol (VENTOLIN HFA) 108 (90 Base) MCG/ACT inhaler Inhale 2 puffs into the lungs every 4-6 hours as needed for cough, wheeze, shortness of breath or chest tightness. 18 g 1  . allopurinol (ZYLOPRIM) 100 MG tablet Take 100 mg by mouth daily with supper.    . Cholecalciferol (VITAMIN D-3) 125 MCG (5000 UT) TABS Take 5,000 Units by mouth at bedtime.    . colchicine 0.6 MG tablet Take 0.6 mg by mouth daily with lunch.    . Cranberry (CRAN-MAX) 500 MG CAPS Take 500 mg by mouth daily after breakfast.    . cyclobenzaprine  (FLEXERIL) 10 MG tablet Take 10 mg by mouth See admin instructions. Take one tablet (10 mg) by mouth daily at bedtime, may also take one tablet (10 mg) daily as needed for pain    . diphenhydrAMINE (BENADRYL) 12.5 MG/5ML liquid Take 12.5 mg by mouth daily as needed for itching or allergies.    Marland Kitchen EPINEPHrine (EPIPEN 2-PAK) 0.3 mg/0.3 mL IJ SOAJ injection Inject 0.3 mg into the muscle once as needed for anaphylaxis (severe allergic reaction).    Marland Kitchen levothyroxine (SYNTHROID) 112 MCG tablet Take 112 mcg by mouth daily before breakfast.    . losartan (COZAAR) 25 MG tablet losartan 25 mg tablet  Take 1 tablet every day by oral route.    . Polyvinyl Alcohol-Povidone (REFRESH OP) Place 1 drop into both eyes daily as needed (itching/ dry eyes).    . Probiotic Product (PROBIOTIC PO) Take 1 tablet by mouth 3 (three) times a week.    . traMADol (ULTRAM) 50 MG tablet Take 50 mg by mouth every 8 (eight) hours as needed (pain).    . TURMERIC PO Take 1,500 mg by mouth daily.     No current facility-administered medications for this visit.     Known medication allergies: Allergies  Allergen Reactions  . Ceclor [Cefaclor] Shortness Of Breath, Itching and Rash  . Metformin And Related Shortness Of Breath, Rash and Other (See Comments)    Fatigue, scalp rash, shakiness  . Voltaren [Diclofenac] Shortness Of Breath  and Swelling    Reaction to gel - throat swelling  . Morphine And Related Nausea And Vomiting    Projectile vomiting  . Amlodipine Swelling and Other (See Comments)    Minor shortness of breath, fatigue, headache,   . Arava [Leflunomide] Nausea And Vomiting and Other (See Comments)    Chest pain, tachycardia, elevated bp  . Atorvastatin Other (See Comments)    Severe joint pain and abdominal pain  . Boswellia Hives  . Cephalosporins Other (See Comments)    Unknown reaction  . Clindamycin/Lincomycin Diarrhea    Severe diarrhea  . Gemfibrozil Itching, Nausea And Vomiting and Other (See Comments)     Muscle pain, hot flashes  . Lisinopril Cough  . Lodine [Etodolac] Other (See Comments)    Stomach and abdominal pain, mouth ulcers  . Medrol [Methylprednisolone] Other (See Comments)    Stomach pain/ tachycardia  . Methotrexate Derivatives Other (See Comments)    Fatigue/ GI symptoms/ headache/ muscle pain/ face and scalp rash, mouth ulcers  . Niacin And Related Other (See Comments)    Flushing, heartburn, insomnia, joint pain, elevated heart rate  . Plaquenil [Hydroxychloroquine] Itching and Other (See Comments)    Blisters on scalp   . Pravastatin Other (See Comments)    Muscle pain  . Rayos [Prednisone] Other (See Comments)    Stomach pain, tachycardia, muscle spasms, parathesia  . Sulfa Antibiotics Other (See Comments)    Unknown reaction  . Unasyn [Ampicillin-Sulbactam Sodium] Itching and Other (See Comments)    Blisters on scalp   . Zocor [Simvastatin] Diarrhea and Other (See Comments)    Abdominal pain,   . Actemra [Tocilizumab] Rash and Other (See Comments)    Facial rash, rash at injection site, rectal bleeding, headache  . Baclofen Rash    Petechial face rash  . Enbrel [Etanercept] Rash and Other (See Comments)    Redness, swelling at injection site, tightness in throat w/ mild respiratory distress, muscle spasms, bell's palsy, discoid lupus  . Ibuprofen Swelling and Rash    Swollen face  . Other Itching, Rash and Other (See Comments)    monsel solution caused rash, itching  . Penicillins Rash    Did it involve swelling of the face/tongue/throat, SOB, or low BP? No Did it involve sudden or severe rash/hives, skin peeling, or any reaction on the inside of your mouth or nose? Yes Did you need to seek medical attention at a hospital or doctor's office? No When did it last happen?approx 69 years old If all above answers are "NO", may proceed with cephalosporin use.  . Simponi [Golimumab] Hives, Itching and Rash  . Zetia [Ezetimibe] Rash, Other (See Comments)  and Cough    Headache, itchy rash, fatigue, dry mouth, dry cough     Physical examination: Blood pressure 130/78, pulse (!) 102, resp. rate 18, SpO2 95 %.  General: Alert, interactive, in no acute distress. HEENT: PERRLA, TMs pearly gray, turbinates non-edematous without discharge, post-pharynx non erythematous. Neck: Supple without lymphadenopathy. Lungs: Clear to auscultation without wheezing, rhonchi or rales. {no increased work of breathing. CV: Normal S1, S2 without murmurs. Abdomen: Nondistended, nontender. Skin: HeLeft the at the medial IP joint with some overlying erythema. Extremities:  No clubbing, cyanosis or edema.  She is able to flex her pinky at both joints Neuro:   Grossly intact.  Diagnositics/Labs: Risk and benefits discussed regarding penicillin skin testing and challenge.  Consent obtained to proceed with testing and challenge.  Penicillin skin testing performed Histamine prick  2+ Control prick negative Pre-Pen prick negative Penicillin G 5000 unit/mL prick negative  Control intradermal negative Pre-Pen intradermal negative Penicillin G 50 unit/mL intradermal negative penicillin G 500 unit/mL intradermal negative Penicillin G 5000 units/mL intradermal negative  Oral challenge proceeded with a 1% dose of total dose of penicillin V 250 mg /5 mL 500 mg total dose.  She received 0.1 mL of the solution.  Approximately 5 minutes into the dose she started to report a sensation in her throat that felt like a tickle or a funny sensation in her throat.  She then felt like she was having some difficulty breathing.  On exam she did not have any evidence of posterior oropharyngeal edema or erythema.  Lung exam clear.  She had open airway with audible breath sounds over her neck bilaterally.  She was given a dose of Benadryl 12.5 mg which is the dose she typically can tolerate.  Due to complaint of difficulty breathing we did provide her with epinephrine IM 0.3 mg dose.  She had  vitals that were stable throughout.  She was monitored for an additional hour with improvement in her symptoms.  She was able to discharge home in stable condition.  Assessment and plan:   Drug allergy -Penicillin skin testing was negative however oral challenge was not passed.  Due to onset of symptoms which necessitated treatment she is deemed still penicillin allergic and needs to continue to avoid penicillin based antibiotics. -She should continue her current drug allergy avoidance at this time.  Fall -Upon coming into the office she did have a fall where she per report landed on her left knee and hyperextended her left pinky finger.  She does have some overlying erythema that likely is turning into bruise of the medial portion of her pinky.  I did advised that she immobilize the pinky and if swelling and or pain or range of motion worsens that she likely should have the hand imaged.  Advised that she had discussed with her primary care in regards to this.  I appreciate the opportunity to take part in Kristy Gill's care. Please do not hesitate to contact me with questions.  Sincerely,   Margo Aye, MD Allergy/Immunology [facility]

## 2020-05-01 NOTE — Telephone Encounter (Signed)
Patient is doing well after the reaction on 04-28-20. She did have a quarter size local reaction/swelling at her epinephrine injection site. She did not have any further issues.

## 2020-05-02 NOTE — Telephone Encounter (Signed)
Good to hear.  Thanks Kayla.

## 2020-05-13 IMAGING — MR MR THORACIC SPINE W/O CM
5 of 6 series · 24 of 48 positions shown · non-contrast
Comparison: None.

CLINICAL DATA: Headaches and gait disorder

EXAM:
MRI THORACIC SPINE WITHOUT CONTRAST
TECHNIQUE: Multiplanar, multisequence MR imaging of the thoracic spine was
performed. No intravenous contrast was administered.

[Series 3: T1 · sagittal · 6.0mm · 1.23mm/px · 2 of 9 slices shown (1 of 2)]
[im 1/9]
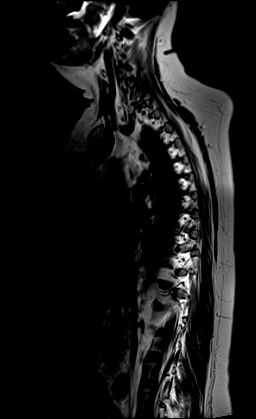
[im 9/9]
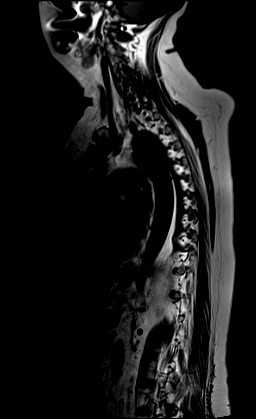

[Series 4: T2 · sagittal · 3.0mm · 0.76mm/px · 6 of 17 slices shown (1 of 2)]
[im 1/17]
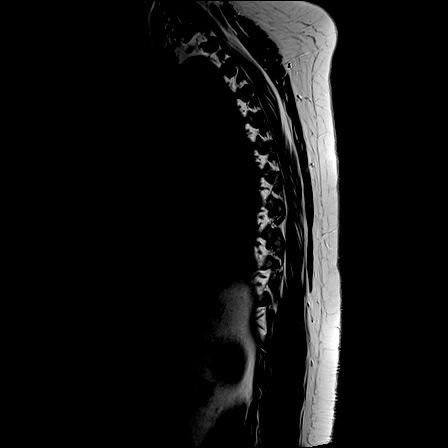
[im 4/17]
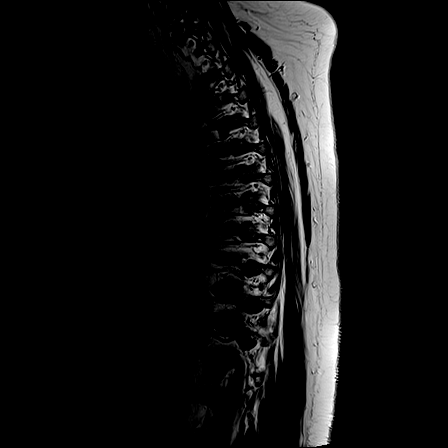
[im 7/17]
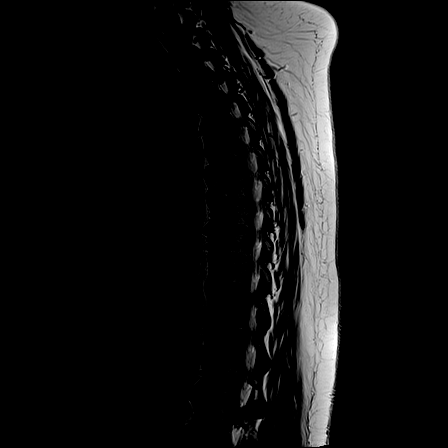
[im 10/17]
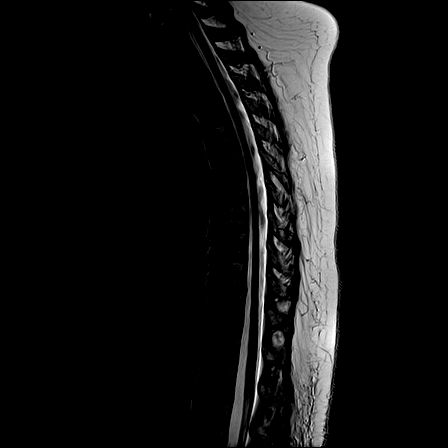
[im 13/17]
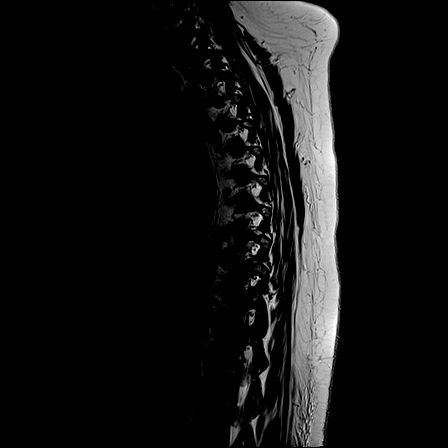
[im 17/17]
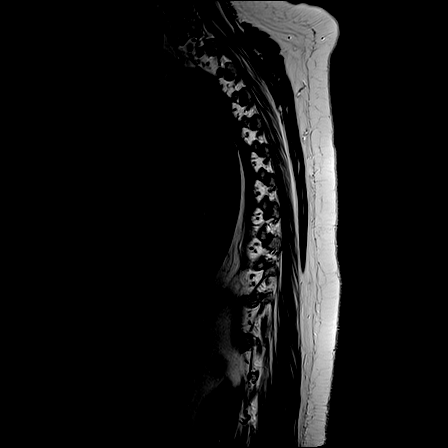

[Series 5: T1 · sagittal · 3.0mm · 0.76mm/px · 6 of 17 slices shown (2 of 2)]
[im 1/17]
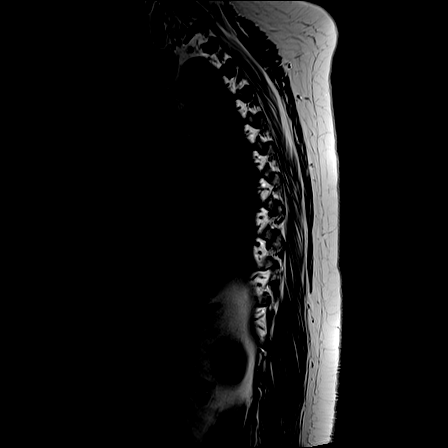
[im 4/17]
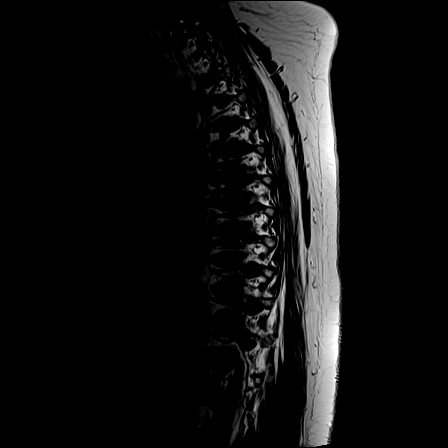
[im 7/17]
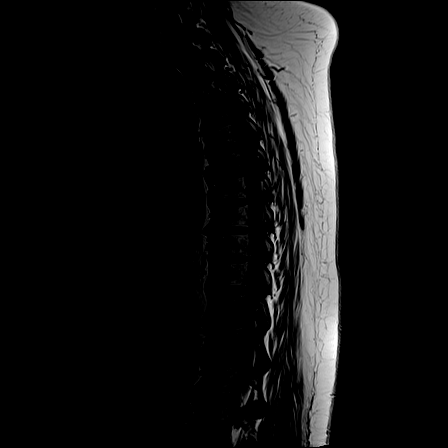
[im 10/17]
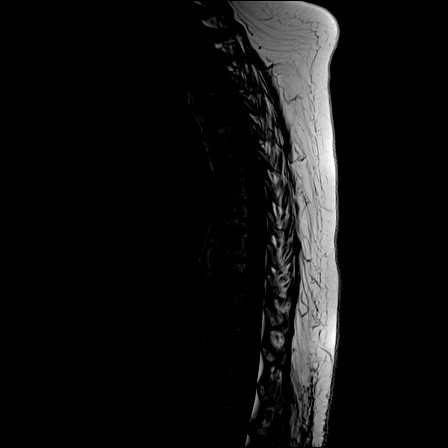
[im 13/17]
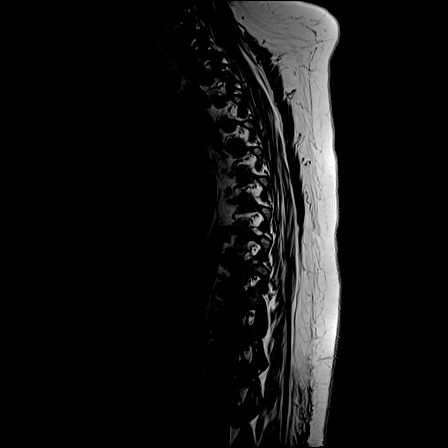
[im 17/17]
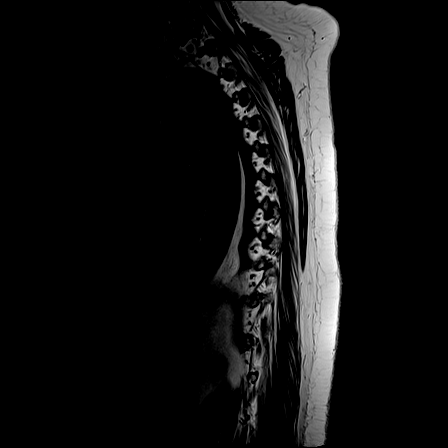

[Series 6: STIR · sagittal · 3.0mm · 0.38mm/px · 2 of 17 slices shown]
[im 1/17]
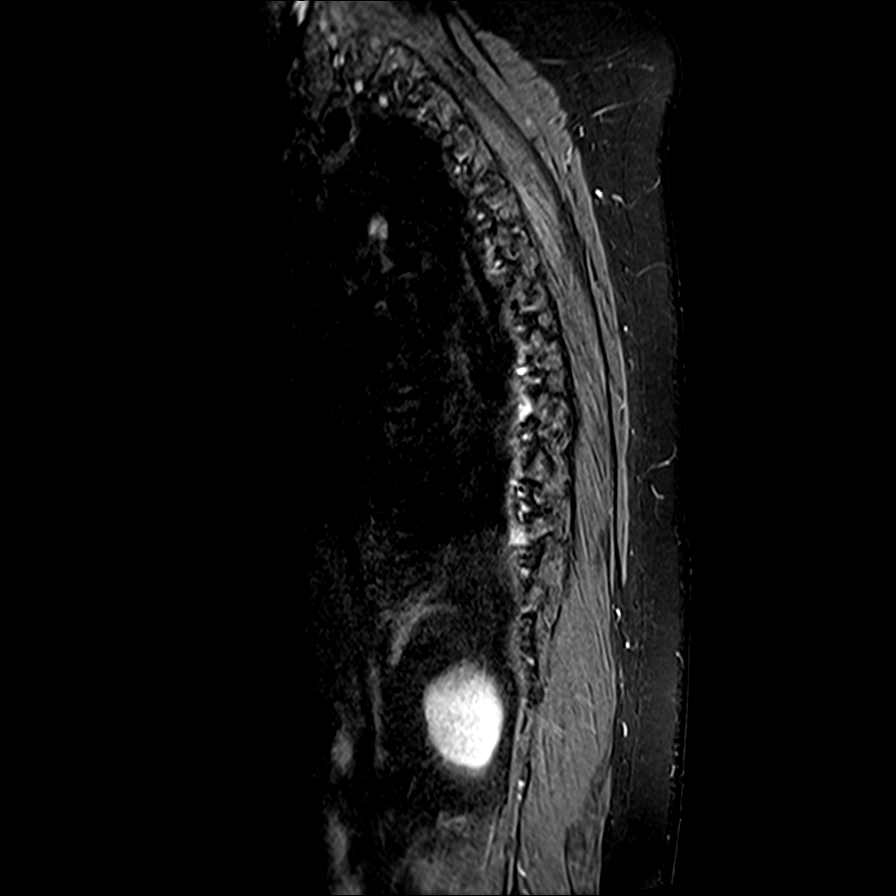
[im 4/17]
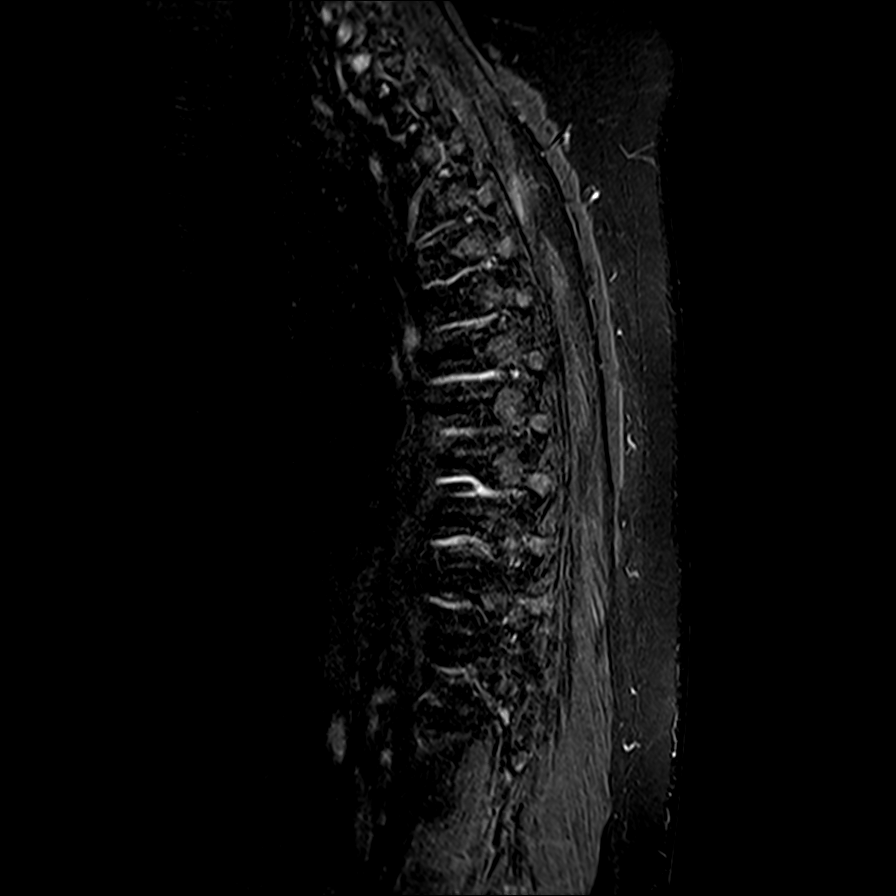

[Series 7: T2 · axial · 4.0mm · 0.59mm/px · z∈[-334,-130]mm · 8 of 39 slices shown (2 of 2)]
[im 1/39]
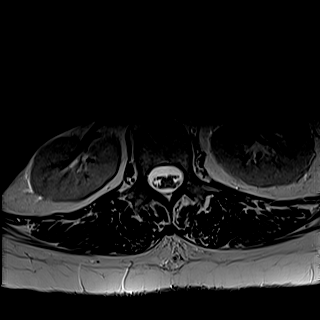
[im 6/39]
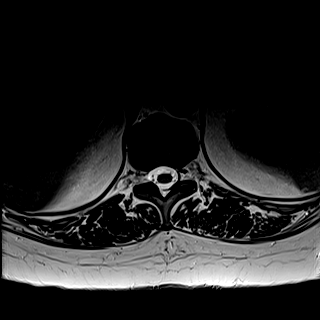
[im 12/39]
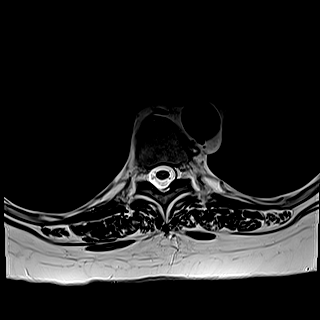
[im 18/39]
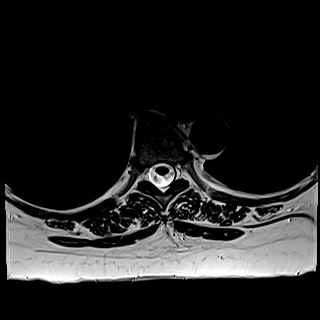
[im 21/39]
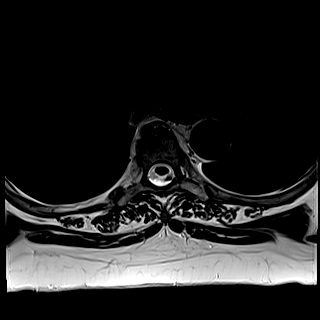
[im 27/39]
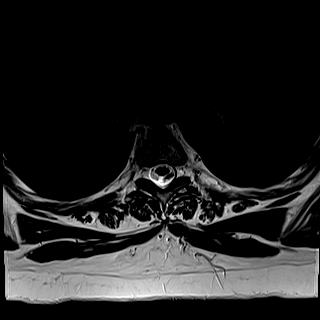
[im 33/39]
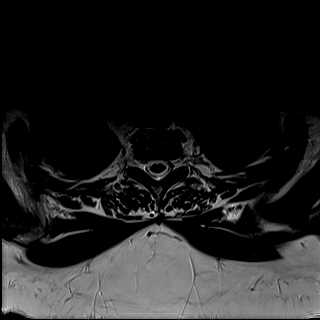
[im 39/39]
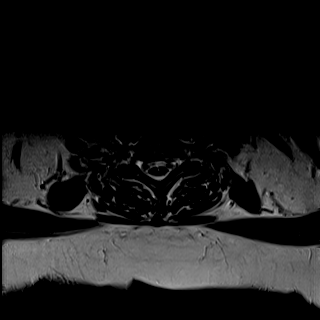

[24 of 48 positions shown; findings below may reference images not displayed]

FINDINGS: Alignment:  Physiologic.

Vertebrae: No fracture, evidence of discitis, or bone lesion.

Cord:  Normal signal and morphology.

Paraspinal and other soft tissues: Negative.

Disc levels:

No disc herniation or stenosis.
IMPRESSION: Normal thoracic spine MRI.

## 2020-06-12 ENCOUNTER — Telehealth: Payer: Self-pay | Admitting: Allergy

## 2020-06-12 NOTE — Telephone Encounter (Signed)
Please advise 

## 2020-06-12 NOTE — Telephone Encounter (Signed)
[facility] called requesting all lab/blood work for this patient from visits 03/10/20 and 04/28/20. Fax #: [phone] put to the attention of Carmen.

## 2020-06-15 DIAGNOSIS — M79645 Pain in left finger(s): Secondary | ICD-10-CM | POA: Insufficient documentation

## 2020-08-23 ENCOUNTER — Encounter: Payer: Self-pay | Admitting: Neurology

## 2020-08-23 ENCOUNTER — Ambulatory Visit (INDEPENDENT_AMBULATORY_CARE_PROVIDER_SITE_OTHER): Payer: No Typology Code available for payment source | Admitting: Neurology

## 2020-08-23 VITALS — BP 158/74 | HR 81 | Ht 61.0 in | Wt 187.0 lb

## 2020-08-23 DIAGNOSIS — G4739 Other sleep apnea: Secondary | ICD-10-CM

## 2020-08-23 DIAGNOSIS — Z9189 Other specified personal risk factors, not elsewhere classified: Secondary | ICD-10-CM | POA: Insufficient documentation

## 2020-08-23 DIAGNOSIS — E669 Obesity, unspecified: Secondary | ICD-10-CM | POA: Diagnosis not present

## 2020-08-23 DIAGNOSIS — G709 Myoneural disorder, unspecified: Secondary | ICD-10-CM | POA: Diagnosis not present

## 2020-08-23 DIAGNOSIS — I480 Paroxysmal atrial fibrillation: Secondary | ICD-10-CM | POA: Diagnosis not present

## 2020-08-23 DIAGNOSIS — G4736 Sleep related hypoventilation in conditions classified elsewhere: Secondary | ICD-10-CM

## 2020-08-23 NOTE — Patient Instructions (Signed)
Safe Surgery and Sleep Apnea Sleep apnea is a condition in which breathing pauses or becomes shallow during sleep. Most people with the condition are not aware that they have it. It is important for your health care providers to know whether or not you have sleep apnea, especially if you are having surgery. Sleep apnea can increase your risk of complications during and after surgery. What is sleep apnea screening? Sleep apnea screening is a test to determine if you are at risk for sleep apnea. Before you have surgery, get screened for sleep apnea and talk with your surgeon and primary health care provider about your results. Screening usually involves answering a list of questions about your sleep quality. Ask your health care provider if you can be screened, or take a screening test yourself. You can find these tests online at the American Sleep Apnea Association website. Some questions you may be asked include:  Do you snore?  Is your sleep restless?  Do you have daytime sleepiness?  Has a partner or spouse told you that you stop breathing during sleep?  Have you had trouble concentrating or memory loss? Answer these questions honestly. If a screening test is positive, this means you are at risk for the condition. Further testing may be needed to confirm a diagnosis of sleep apnea. Why does sleep apnea increase the risk for complications? Untreated sleep apnea increases the risk for certain complications during and after surgery. This is because when you have sleep apnea, your airways are more sensitive to medicines used during surgery. The airways can collapse and block the flow of air.  Having untreated sleep apnea can increase your risk for:  A longer stay in the recovery room or hospital.  Breathing difficulties such as low oxygen levels after surgery.  Increased pain after surgery.  Irregular heart rhythms.  Stroke.  Heart attack. You and your health care provider can take steps  to help prevent these and other complications. What should I do if I have sleep apnea?  Before surgery  Tell your health care provider and anesthesia specialist that you have sleep apnea. Discuss your individual risks based on your screening results, the type of surgery you will be having, and other medical conditions that you have.  If you have a sleep apnea device (positive airway pressure device), wear it as prescribed. If you have not been wearing your device, talk with your health care provider about why you have not been wearing it. There are ways to improve your use of the device, such as: ? Adjusting the mask. ? Adding humidified air. ? Getting treatment for nasal congestion.  Do not use any products that contain nicotine or tobacco, such as cigarettes and e-cigarettes. If you need help quitting, ask your health care provider. On the day of surgery  If instructed by your health care provider, bring your sleep apnea device with you.  Wear your sleep apnea device when you are sleeping during your hospital stay, or as told by your health care provider.  Ask your health care provider what special considerations will be taken during and after your surgery. After surgery  You may need to be given extra oxygen and wear a continuous oxygen monitor (pulse oximetry).  For your safety, you may need to stay in the recovery room or hospital for longer than is normal.  Follow instructions from your health care provider about wearing your sleep device: ? Anytime you are sleeping, including during daytime naps. ? While taking   prescription pain medicines, sleeping medicines, or medicines that make you drowsy.  If your health care provider approves, raise the head of your bed or lie on your side. Do not lie flat on your back.  Follow instructions from your health care provider about medicines: ? Avoid using sleep medicines unless they are prescribed by a health care provider who is aware of  the results of your sleep apnea screening. ? Avoid using sleep medicines while taking opioid pain medicine. ? Limit your use of opioid pain medicines as much as possible. Ask your health care provider what is a safe amount to use. ? Ask about using pain medicines that do not affect your breathing, such as NSAIDs or acetaminophen. Where to find more information For more information about sleep apnea screening and healthy sleep, visit these websites:  Centers for Disease Control and Prevention: www.cdc.gov/sleep/index.html  American Sleep Apnea Association: www.sleepapnea.org Contact a health care provider if:  You have sleep apnea or think you may be at risk for sleep apnea, and you are scheduled for surgery. Get help right away if:  You have trouble breathing.  You are very drowsy and cannot stay awake.  You are told that you have pauses in your breathing during sleep after surgery.  You have chest pain.  You have a fast heartbeat. Summary  It is important for your health care providers to know whether or not you have sleep apnea, especially if you are having surgery.  If you have sleep apnea, you are at an increased risk for complications during surgery.  You and your health care provider can take precautions to help prevent complications. If you have sleep apnea, make sure to tell your health care provider and anesthesia specialist. This information is not intended to replace advice given to you by your health care provider. Make sure you discuss any questions you have with your health care provider. Document Revised: 01/29/2019 Document Reviewed: 01/23/2017 Elsevier Patient Education  2020 [employer]  

## 2020-08-23 NOTE — Progress Notes (Signed)
SLEEP MEDICINE CLINIC    Provider:  Melvyn Novas, MD  Primary Care Physician:  Center, [facility] [address] [place] [address]     Referring Provider: Center, [facility] [address] [place],  [place] [address]          Chief Complaint according to patient   Patient presents with:    . New Patient (Initial Visit)     pt alone, rm 11. presents today to assess OSA is present. 20+ yrs ago had a SS which didnt indicate apnea to be present, they advised her to sleep on her side and she has trained herself to do so. surgery recently in jan 2021 and the hospital personnel advised that she snores and her oxygen level would drop. pt sleeps alone, she does wake up 3x to go to bathroom and snores when she is on her back. she is a mouth breather.       HISTORY OF PRESENT ILLNESS:  Kristy Gill is a 69 y.o. 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 She is not vaccinated against COVID 19.   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   She presented to urgent care on Saturday , following after leaving the hospital on a Wednesday - she was readmitted.  She now carries a diagnosis of Neuritis ( Dr.Gramig)  and the hospital personnel advised that she snores and her oxygen level would drop.   I have the pleasure of seeing Kristy Gill  On 08-23-2020, a left -handed Caucasian female with a possible sleep disorder.  She   has a past medical history of Arthritis, Breast disorder, Clawfoot, acquired, Diabetes mellitus without complication (HCC), Gall stones, Gout, Hypercalcemia, Hyperlipidemia, Hypertension, Hypertensive heart disease without heart failure, Obesity, Osteoporosis, Pancreatitis, Primary osteoarthritis, Rotator cuff rupture, bilateral shoulder surgeries. Hand surgery, phrnic nerve damage,  ulnar nerve damage, and Thyroid disease.   The patient had the first sleep study in circa 1995 - no apnea at that time.    Sleep relevant medical history: Nocturia 3 times- not always woken by the urgeof urination,  Sleep walking in childhood, used to have frequent sinusitis-  has cervical spine DDD, C6, had nasal septoplasty correcting a traumatic injury. .    Family medical Verne Spurr history:  She is one of 6 siblings, sister has PLMs and RLS- no other family member on CPAP with OSA,.    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 Family status is single - with a special needs adoptee son, now 1.  Pets are present. 2 cats  Tobacco use-never .  ETOH use: never ,  Caffeine intake- none for 15 years. Atrial fib related.  Regular exercise in form of walking   Hobbies :embroiderer       Sleep habits are as follows:  The patient's dinner time is between 6 PM.  Sons comes home by 5.30 PM- he works as a Child psychotherapist. The patient goes to bed at 10-11 PM and she takes pain medication to sleep better- she continues to sleep for 2-3 hours, wakes for 1-2 bathroom breaks. Bedroom is quiet , dark  and cool, fan in the background.  The preferred sleep position is either side , with the support of 1 pillow. Waterbed-Thin and mid firm-  Dreams are reportedly frequent/vivid.  7.30  AM is the usual rise time. Averages 8 hours of sleep. The patient wakes up spontaneously.  She reports not feeling refreshed or restored in AM, with symptoms such as dry mouth, grogginess, no stiffness-  residual fatigue.  Naps are taken only on Sunday , lasting 20 minutes and are more  refreshing than nocturnal sleep.    Review of Systems: Out of a complete 14 system review, the patient complains of only the following symptoms, and all other reviewed systems are negative.:  SOB   transient.  snoring, fragmented sleep, light sleeper.    How likely are you to doze in the following situations: 0 = not likely, 1 = slight chance, 2 = moderate chance, 3 = high chance   Sitting and Reading? Watching Television? Sitting inactive in a public place (theater or meeting)? As a passenger in a car for an hour without a break? Lying down in the afternoon when circumstances permit? Sitting and talking to someone? Sitting quietly after lunch without alcohol? In a car, while stopped for a few minutes in traffic?   Total = 2/ 24 points   FSS endorsed at 14/ 63 points.   Social History   Socioeconomic History  . Marital status: Single    Spouse name: Not on file  . Number of children: Not on file  . Years of education: Not on file  . Highest education level: Not on file  Occupational History  . Not on file  Tobacco Use  . Smoking status: Never Smoker  . Smokeless tobacco: Never Used  Substance and Sexual Activity  . Alcohol use: Never  . Drug use: Never  . Sexual activity: Not on file  Other Topics Concern  . Not on file  Social History Narrative  . Not on file   Social Determinants of Health   Financial Resource Strain:   . Difficulty of Paying Living Expenses: Not on file  Food Insecurity:   . Worried About Programme researcher, broadcasting/film/video in the Last Year: Not on file  . Ran Out of Food in the Last Year: Not on file  Transportation Needs:   . Lack of Transportation (Medical): Not on file  . Lack of Transportation (Non-Medical): Not on file  Physical Activity:   . Days of Exercise per Week: Not on file  . Minutes of Exercise per Session: Not on file  Stress:   . Feeling of Stress : Not on file  Social Connections:   . Frequency of Communication with Friends and Family: Not on file  . Frequency of Social Gatherings with Friends and Family: Not on file  . Attends Religious Services: Not on file  . Active Member of Clubs or Organizations: Not on file  . Attends Occupational hygienist Meetings: Not on file  . Marital Status: Not on file    Family History  Problem Relation Age of Onset  . Heart attack Mother   . Diabetes Mother   . Cancer Father   . Breast cancer Sister   . Bone cancer Sister   . Prostate cancer Brother   . Diabetes Paternal Grandmother   . Angioedema Neg Hx   . Asthma Neg Hx   . Atopy Neg Hx   . Eczema Neg Hx   . Immunodeficiency Neg Hx   .  Urticaria Neg Hx   . Allergic rhinitis Neg Hx     Past Medical History:  Diagnosis Date  . Arthritis   . Breast disorder   . Clawfoot, acquired   . Diabetes mellitus without complication (HCC)   . Gall stones   . Gout   . Hypercalcemia   . Hyperlipidemia   . Hypertension   . Hypertensive heart disease without heart failure   . Obesity   . Osteoporosis   . Pancreatitis   . Primary osteoarthritis   . Rotator cuff rupture   . Thyroid disease    hypothryoidism    Past Surgical History:  Procedure Laterality Date  . APPENDECTOMY  1978  . CHOLECYSTECTOMY  1998  . hand reconstruction Right 10/26/2019  . JOINT REPLACEMENT Right 2017   Right index joint replacement with right thumb fusion  . NASAL SEPTOPLASTY W/ TURBINOPLASTY  1980  . 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 Rotator cuff surgery     Bilateral bicep tenotomy with rotator cuff surgeries in 2010, 2011  . Thumb joint fusion Left 07/2015     Current Outpatient Medications on File Prior to Visit  Medication Sig Dispense Refill  . acetaminophen (TYLENOL) 500 MG tablet Take 1,000 mg by mouth daily as needed (pain).    Marland Kitchen acetaminophen (TYLENOL) 650 MG CR tablet Take 1,300 mg by mouth at bedtime.    Marland Kitchen albuterol (VENTOLIN HFA) 108 (90 Base) MCG/ACT inhaler Inhale 2 puffs into the lungs every 4-6 hours as needed for cough, wheeze, shortness of breath or chest tightness. 18 g 1  . allopurinol (ZYLOPRIM) 100 MG tablet Take 100 mg by mouth daily with supper.    . Cholecalciferol (VITAMIN D-3) 125 MCG (5000 UT) TABS Take  5,000 Units by mouth at bedtime.    . colchicine 0.6 MG tablet Take 0.6 mg by mouth daily with lunch.    . Cranberry (CRAN-MAX) 500 MG CAPS Take 500 mg by mouth daily after breakfast.    . cyclobenzaprine (FLEXERIL) 10 MG tablet Take 10 mg by mouth See admin instructions. Take one tablet (10 mg) by mouth daily at bedtime, may also take one tablet (10 mg) daily as needed for pain    . diphenhydrAMINE (BENADRYL) 12.5 MG/5ML liquid Take 12.5 mg by mouth daily as needed for itching or allergies.    Marland Kitchen EPINEPHrine (EPIPEN 2-PAK) 0.3 mg/0.3 mL IJ SOAJ injection Inject 0.3 mg into the muscle once as needed for anaphylaxis (severe allergic reaction).    Marland Kitchen levothyroxine (SYNTHROID) 112 MCG tablet Take 112 mcg by mouth daily before breakfast.    . losartan (COZAAR) 25 MG tablet losartan 25 mg tablet  Take 1 tablet every day by oral route.    . Polyvinyl Alcohol-Povidone (REFRESH OP) Place 1 drop into both eyes daily as needed (itching/ dry eyes).    . Probiotic Product (PROBIOTIC PO) Take 1 tablet by mouth 3 (three) times a week.    . traMADol (ULTRAM) 50 MG tablet Take 50 mg by mouth every 8 (eight) hours as needed (pain).    . TURMERIC PO Take 1,500 mg by mouth daily.     No current facility-administered medications on file prior to visit.    Allergies  Allergen Reactions  . Ceclor [Cefaclor] Shortness Of Breath, Itching and Rash  . Metformin And Related Shortness Of Breath, Rash and Other (See Comments)    Fatigue, scalp rash, shakiness  . Voltaren [Diclofenac] Shortness Of Breath and Swelling    Reaction to  gel - throat swelling  . Morphine And Related Nausea And Vomiting    Projectile vomiting  . Amlodipine Swelling and Other (See Comments)    Minor shortness of breath, fatigue, headache,   . Arava [Leflunomide] Nausea And Vomiting and Other (See Comments)    Chest pain, tachycardia, elevated bp  . Atorvastatin Other (See Comments)    Severe joint pain and abdominal pain  . Boswellia Hives   . Cephalosporins Other (See Comments)    Unknown reaction  . Clindamycin/Lincomycin Diarrhea    Severe diarrhea  . Gemfibrozil Itching, Nausea And Vomiting and Other (See Comments)    Muscle pain, hot flashes  . Lisinopril Cough  . Lodine [Etodolac] Other (See Comments)    Stomach and abdominal pain, mouth ulcers  . Medrol [Methylprednisolone] Other (See Comments)    Stomach pain/ tachycardia  . Methotrexate Derivatives Other (See Comments)    Fatigue/ GI symptoms/ headache/ muscle pain/ face and scalp rash, mouth ulcers  . Niacin And Related Other (See Comments)    Flushing, heartburn, insomnia, joint pain, elevated heart rate  . Plaquenil [Hydroxychloroquine] Itching and Other (See Comments)    Blisters on scalp   . Pravastatin Other (See Comments)    Muscle pain  . Rayos [Prednisone] Other (See Comments)    Stomach pain, tachycardia, muscle spasms, parathesia  . Sulfa Antibiotics Other (See Comments)    Unknown reaction  . Unasyn [Ampicillin-Sulbactam Sodium] Itching and Other (See Comments)    Blisters on scalp   . Zocor [Simvastatin] Diarrhea and Other (See Comments)    Abdominal pain,   . Actemra [Tocilizumab] Rash and Other (See Comments)    Facial rash, rash at injection site, rectal bleeding, headache  . Baclofen Rash    Petechial face rash  . Enbrel [Etanercept] Rash and Other (See Comments)    Redness, swelling at injection site, tightness in throat w/ mild respiratory distress, muscle spasms, bell's palsy, discoid lupus  . Ibuprofen Swelling and Rash    Swollen face  . Other Itching, Rash and Other (See Comments)    monsel solution caused rash, itching  . Penicillins Rash    Did it involve swelling of the face/tongue/throat, SOB, or low BP? YES Did it involve sudden or severe rash/hives, skin peeling, or any reaction on the inside of your mouth or nose? NO Did you need to seek medical attention at a hospital or doctor's office? YES When did it last  happen?04/2020 If all above answers are "NO", may proceed with cephalosporin use.  . Simponi [Golimumab] Hives, Itching and Rash  . Zetia [Ezetimibe] Rash, Other (See Comments) and Cough    Headache, itchy rash, fatigue, dry mouth, dry cough    Physical exam:  Today's Vitals   08/23/20 1140  BP: (!) 158/74  Pulse: 81  Weight: 187 lb (84.8 kg)  Height: 5\' 1"  (1.549 m)   Body mass index is 35.33 kg/m.   Wt Readings from Last 3 Encounters:  08/23/20 187 lb (84.8 kg)  03/10/20 188 lb 12.8 oz (85.6 kg)  10/31/19 185 lb 3 oz (84 kg)     Ht Readings from Last 3 Encounters:  08/23/20 5\' 1"  (1.549 m)  03/10/20 4' 11.5" (1.511 m)  10/31/19 5\' 1"  (1.549 m)      General: The patient is awake, alert and appears not in acute distress. The patient is well groomed. Head: Normocephalic, atraumatic. Neck is supple. Mallampati 1- TMJ click, pain- arthritis.   neck circumference:16.5  inches .  Nasal airflow  patent.  Retrognathia is not seen. Small lower jaw but not recessed.  Dental status: native.  Cardiovascular:  Regular rate and cardiac rhythm by pulse,  without distended neck veins. Respiratory: Lungs are clear to auscultation.  Skin:  Without evidence of ankle edema, or rash. Trunk: The patient's posture is erect.   Neurologic exam : The patient is awake and alert, oriented to place and time.   Memory subjective described as intact.  Attention span & concentration ability appears normal.  Speech is fluent,  without  dysarthria, dysphonia or aphasia.  Mood and affect are appropriate.   Cranial nerves: no loss of smell or taste reported. Pupils are equal and briskly reactive to light. Funduscopic exam deferred. .  Extraocular movements in vertical and horizontal planes were intact and without nystagmus. No Diplopia. Visual fields by finger perimetry are intact. Hearing was intact to soft voice and finger rubbing.   Facial sensation intact to fine touch. Facial motor strength  is symmetric and tongue and uvula move midline. Neck ROM : rotation, tilt and flexion extension were normal for age and shoulder shrug was symmetrical.    Motor exam:  Symmetric bulk, tone and ROM.   Normal tone without cog -wheeling, symmetric grip strength .   Sensory:  Fine touch and vibration were tested  and  normal.  Proprioception tested in the upper extremities was normal.   Coordination: Rapid alternating movements in the fingers/hands were of normal speed.  The Finger-to-nose maneuver was intact without evidence of ataxia, dysmetria or tremor.   Gait and station: Patient could rise unassisted from a seated position, walked without assistive device.  Stance is of normal width/ base.  Right knee was replaced-  2004- restricted ROM, and weakness.  Toe and heel walk were deferred.  Deep tendon reflexes: in the  upper  extremities are symmetric and intact.  Babinski response was deferred.       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  She has a larger neck and BMI over 35.   1)  HST is enough for this patient to screen-  confirm the presence of shallow breathing, apnea and hypoxemia.     My Plan is to proceed with:  HST  I would like to thank Center, [facility] [place], [facility] [address] [place],  [place] [address] for allowing me to meet with and to take care of this pleasant patient.     I plan to follow up either personally or through our NP within 2-4  Month. BUT ONLY IF HST IS POSITIVE>    CC: I will share my notes with [facility] -  Electronically signed by: Melvyn Novas, MD 08/23/2020 11:43  AM  [facility] Board certified by The [employer] of Sleep Medicine and Diplomate of the [employer] of Sleep Medicine. Board certified In Neurology through the ABPN, Fellow of the [employer] of Neurology. Medical Director of [facility].

## 2020-08-24 ENCOUNTER — Telehealth: Payer: Self-pay | Admitting: Neurology

## 2020-08-24 NOTE — Telephone Encounter (Signed)
We are already aware of this information, The sleep lab will be in contact to schedule the patient for the sleep study soon.

## 2020-08-24 NOTE — Telephone Encounter (Signed)
Pt called, do not have to contact [facility] for authorization, its in the authorization you already have in hand. You can locate under procedural overview #4 or in home testing. Would like a call from the nurse.

## 2020-10-02 ENCOUNTER — Ambulatory Visit (INDEPENDENT_AMBULATORY_CARE_PROVIDER_SITE_OTHER): Payer: No Typology Code available for payment source | Admitting: Neurology

## 2020-10-02 DIAGNOSIS — G4733 Obstructive sleep apnea (adult) (pediatric): Secondary | ICD-10-CM | POA: Diagnosis not present

## 2020-10-02 DIAGNOSIS — E669 Obesity, unspecified: Secondary | ICD-10-CM

## 2020-10-02 DIAGNOSIS — G4739 Other sleep apnea: Secondary | ICD-10-CM

## 2020-10-02 DIAGNOSIS — I480 Paroxysmal atrial fibrillation: Secondary | ICD-10-CM

## 2020-10-02 DIAGNOSIS — G4736 Sleep related hypoventilation in conditions classified elsewhere: Secondary | ICD-10-CM

## 2020-10-03 NOTE — Progress Notes (Signed)
   [facility]  HOME SLEEP TEST (Watch PAT)  STUDY DATE: 10/03/20  DOB: 1951-01-03  MRN: [id number]  ORDERING CLINICIAN: Melvyn Novas, MD   REFERRING CLINICIAN: [facility]   CLINICAL INFORMATION/ HISTORY: Kristy Gill a 69 y.o. year old Caucasian female and a [facility] patientseen in Consultationon 08/23/2020.  Chiefconcernaccording to patient : Patient underwent right hand surgery in Jan 2021 under cervical block anesthesia, after which she developed immediate breathing difficulties. She stated that her phrenic nerve was damaged. She presented to urgent care on Saturday  following surgery at the hospital on a Wednesday -and she was readmitted. She now carries a diagnosis of Neuritis ( Dr.Gramig)  and the hospital personnel has advised that she snores and her oxygen level would drop, she may have OSA. Kristy Gill has a medical history of Arthritis, Breast disorder, Clawfoot, acquired, Diabetes mellitus without complication (HCC), Gall stones, Gout, Hypercalcemia, Hyperlipidemia, Hypertension, Hypertensive heart disease without heart failure, Obesity, Osteoporosis, Pancreatitis, Primary osteoarthritis, Rotator cuff rupture, bilateral shoulder surgeries, Hand surgery, phrenic nerve damage, ulnar nerve damage, and Thyroid disease. 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    Epworth sleepiness score: 2/24.  Neck Circumference: 16"  BMI: 35.4 kg/m  FINDINGS:   Total Record Time (hours, min): 8 H 0 min  Total Sleep Time (hours, min):  6 H 26 min   Percent REM (%):    27.10 %   Calculated pAHI (per hour):  13.1      REM pAHI:   25.9   NREM pAHI: 8.3 Supine AHI: 68.8   Oxygen Saturation (%) Mean: 94  Minimum oxygen saturation (%):         84   O2 Saturation Range (%): 84-99  O2Saturation (minutes) <=88%: 1.2 min  Pulse Mean (bpm):    75  Pulse Range (63-89)   IMPRESSION: This HST indicated mild OSA (obstructive sleep apnea) at  AHI of 13.1/h with strong REM and supine positional dependency. No clinically significant hypoxemia was recorded.    RECOMMENDATION: 1)Avoid supine sleep -the patient slept only 10 minutes in supine.  2) CPAP treatment is recommended for REM dependent apnea which does not respond to dental device, Inspire device.  I will order an auto-set CPAP 5-14 cm water, 2 cm EPR and mask of patient's choice, with heated humidity.     INTERPRETING PHYSICIAN:  Melvyn Novas, MD [facility] [address], [address] [place], [place] [address] [phone]

## 2020-10-06 NOTE — Progress Notes (Signed)
VA provider is Dr Everett Graff. IMPRESSION: This HST indicated mild OSA (obstructive sleep apnea) at AHI of 13.1/h with strong REM and supine positional dependency. No clinically significant hypoxemia was recorded.    RECOMMENDATION: 1)Avoid supine sleep -the patient slept only 10 minutes in supine.  2) CPAP treatment is recommended for REM dependent apnea which does not respond to dental device, Inspire device.  I will order an auto-set CPAP 5-14 cm water, 2 cm EPR and mask of patient's choice, with heated humidity.

## 2020-10-06 NOTE — Addendum Note (Signed)
Addended by: Melvyn Novas on: 10/06/2020 02:52 PM   Modules accepted: Orders

## 2020-10-09 ENCOUNTER — Telehealth: Payer: Self-pay | Admitting: Neurology

## 2020-10-09 NOTE — Telephone Encounter (Signed)
Called patient to discuss sleep study results. No answer at this time. LVM for the patient to call back.   

## 2020-10-09 NOTE — Telephone Encounter (Signed)
Pt returned call. I advised pt that Dr. Vickey Huger reviewed their sleep study results and found that pt mild sleep apnea. Dr. Vickey Huger recommends that pt start auto CPAP. I reviewed PAP compliance expectations with the pt. Pt is agreeable to starting a CPAP. I advised pt that an order will be sent to a DME, [facility], and [facility] [place] will call the pt within about one week after they file with the pt's insurance. [facility] will show the pt how to use the machine, fit for masks, and troubleshoot the CPAP if needed. A follow up appt will need to be made for insurance purposes with Dr. Vickey Huger or the NP. Pt verbalized understanding of results. Pt had no questions at this time but was encouraged to call back if questions arise. I have sent the order to [facility] and have received confirmation that they have received the order.

## 2020-10-09 NOTE — Telephone Encounter (Signed)
-----   Message from Melvyn Novas, MD sent at 10/06/2020  2:52 PM EST ----- [facility] provider is Dr Everett Graff. IMPRESSION: This HST indicated mild OSA (obstructive sleep apnea) at AHI of 13.1/h with strong REM and supine positional dependency. No clinically significant hypoxemia was recorded.    RECOMMENDATION: 1)Avoid supine sleep -the patient slept only 10 minutes in supine.  2) CPAP treatment is recommended for REM dependent apnea which does not respond to dental device, Inspire device.  I will order an auto-set CPAP 5-14 cm water, 2 cm EPR and mask of patient's choice, with heated humidity.

## 2021-07-19 ENCOUNTER — Ambulatory Visit (INDEPENDENT_AMBULATORY_CARE_PROVIDER_SITE_OTHER): Payer: No Typology Code available for payment source | Admitting: Podiatry

## 2021-07-19 ENCOUNTER — Other Ambulatory Visit: Payer: Self-pay

## 2021-07-19 ENCOUNTER — Ambulatory Visit (INDEPENDENT_AMBULATORY_CARE_PROVIDER_SITE_OTHER): Payer: Medicare PPO

## 2021-07-19 DIAGNOSIS — M21539 Acquired clawfoot, unspecified foot: Secondary | ICD-10-CM | POA: Insufficient documentation

## 2021-07-19 DIAGNOSIS — M19079 Primary osteoarthritis, unspecified ankle and foot: Secondary | ICD-10-CM

## 2021-07-19 DIAGNOSIS — M75120 Complete rotator cuff tear or rupture of unspecified shoulder, not specified as traumatic: Secondary | ICD-10-CM | POA: Insufficient documentation

## 2021-07-19 DIAGNOSIS — M19072 Primary osteoarthritis, left ankle and foot: Secondary | ICD-10-CM | POA: Diagnosis not present

## 2021-07-19 DIAGNOSIS — Z1211 Encounter for screening for malignant neoplasm of colon: Secondary | ICD-10-CM | POA: Insufficient documentation

## 2021-07-19 DIAGNOSIS — M2011 Hallux valgus (acquired), right foot: Secondary | ICD-10-CM

## 2021-07-19 DIAGNOSIS — N649 Disorder of breast, unspecified: Secondary | ICD-10-CM | POA: Insufficient documentation

## 2021-07-19 DIAGNOSIS — G5793 Unspecified mononeuropathy of bilateral lower limbs: Secondary | ICD-10-CM | POA: Diagnosis not present

## 2021-07-19 DIAGNOSIS — M24811 Other specific joint derangements of right shoulder, not elsewhere classified: Secondary | ICD-10-CM | POA: Insufficient documentation

## 2021-07-19 DIAGNOSIS — Z8739 Personal history of other diseases of the musculoskeletal system and connective tissue: Secondary | ICD-10-CM

## 2021-07-19 DIAGNOSIS — M159 Polyosteoarthritis, unspecified: Secondary | ICD-10-CM | POA: Insufficient documentation

## 2021-07-19 DIAGNOSIS — N951 Menopausal and female climacteric states: Secondary | ICD-10-CM | POA: Insufficient documentation

## 2021-07-19 DIAGNOSIS — K219 Gastro-esophageal reflux disease without esophagitis: Secondary | ICD-10-CM | POA: Insufficient documentation

## 2021-07-19 DIAGNOSIS — Z860100 Personal history of colon polyps, unspecified: Secondary | ICD-10-CM | POA: Insufficient documentation

## 2021-07-19 DIAGNOSIS — S42409A Unspecified fracture of lower end of unspecified humerus, initial encounter for closed fracture: Secondary | ICD-10-CM | POA: Insufficient documentation

## 2021-07-19 DIAGNOSIS — Z96659 Presence of unspecified artificial knee joint: Secondary | ICD-10-CM | POA: Insufficient documentation

## 2021-07-19 DIAGNOSIS — H18523 Epithelial (juvenile) corneal dystrophy, bilateral: Secondary | ICD-10-CM | POA: Insufficient documentation

## 2021-07-19 DIAGNOSIS — Z803 Family history of malignant neoplasm of breast: Secondary | ICD-10-CM | POA: Insufficient documentation

## 2021-07-19 DIAGNOSIS — J310 Chronic rhinitis: Secondary | ICD-10-CM | POA: Insufficient documentation

## 2021-07-19 DIAGNOSIS — M19071 Primary osteoarthritis, right ankle and foot: Secondary | ICD-10-CM

## 2021-07-19 DIAGNOSIS — Z8781 Personal history of (healed) traumatic fracture: Secondary | ICD-10-CM

## 2021-07-19 DIAGNOSIS — M1991 Primary osteoarthritis, unspecified site: Secondary | ICD-10-CM | POA: Insufficient documentation

## 2021-07-19 DIAGNOSIS — M25519 Pain in unspecified shoulder: Secondary | ICD-10-CM | POA: Insufficient documentation

## 2021-07-19 DIAGNOSIS — E782 Mixed hyperlipidemia: Secondary | ICD-10-CM | POA: Insufficient documentation

## 2021-07-19 DIAGNOSIS — Z96651 Presence of right artificial knee joint: Secondary | ICD-10-CM | POA: Insufficient documentation

## 2021-07-19 DIAGNOSIS — I119 Hypertensive heart disease without heart failure: Secondary | ICD-10-CM | POA: Insufficient documentation

## 2021-07-19 DIAGNOSIS — M1610 Unilateral primary osteoarthritis, unspecified hip: Secondary | ICD-10-CM | POA: Insufficient documentation

## 2021-07-20 NOTE — Progress Notes (Signed)
Subjective:  Patient ID: Kristy Gill, female    DOB: 12-18-1950,  MRN: [id number] HPI Chief Complaint  Patient presents with   Arthritis    Dx of osteoarthritis and rheumatoid arthritis   Gout    Gout, 7 years duration.    70 y.o. female presents with the above complaint.   ROS: Denies fever chills nausea vomiting muscle aches pains calf pain back pain chest pain shortness of breath.  Past Medical History:  Diagnosis Date   Arthritis    Breast disorder    Clawfoot, acquired    Diabetes mellitus without complication (HCC)    Gall stones    Gout    Hypercalcemia    Hyperlipidemia    Hypertension    Hypertensive heart disease without heart failure    Obesity    Osteoporosis    Pancreatitis    Primary osteoarthritis    Rotator cuff rupture    Thyroid disease    hypothryoidism   Past Surgical History:  Procedure Laterality Date   APPENDECTOMY  1978   CHOLECYSTECTOMY  1998   hand reconstruction Right 10/26/2019   JOINT REPLACEMENT Right 2017   Right index joint replacement with right thumb fusion   NASAL SEPTOPLASTY W/ TURBINOPLASTY  1980   REPLACEMENT TOTAL KNEE Right 03/2003   Rotator cuff surgery     Bilateral bicep tenotomy with rotator cuff surgeries in 2010, 2011   Thumb joint fusion Left 07/2015    Current Outpatient Medications:    acetaminophen (TYLENOL) 500 MG tablet, Take 1,000 mg by mouth daily as needed (pain)., Disp: , Rfl:    acetaminophen (TYLENOL) 650 MG CR tablet, Take 1,300 mg by mouth at bedtime., Disp: , Rfl:    albuterol (VENTOLIN HFA) 108 (90 Base) MCG/ACT inhaler, Inhale 2 puffs into the lungs every 4-6 hours as needed for cough, wheeze, shortness of breath or chest tightness., Disp: 18 g, Rfl: 1   allopurinol (ZYLOPRIM) 100 MG tablet, Take 100 mg by mouth daily with supper., Disp: , Rfl:    allopurinol (ZYLOPRIM) 100 MG tablet, Take 2 tablets by mouth daily., Disp: , Rfl:    calcium-vitamin D (OSCAL WITH D) 500-200 MG-UNIT TABS tablet, Take  1 tablet by mouth 2 (two) times daily., Disp: , Rfl:    carboxymethylcellulose (REFRESH PLUS) 0.5 % SOLN, INSTILL 1 DROP IN EACH EYE FOUR TIMES A DAY, Disp: , Rfl:    Cholecalciferol (VITAMIN D-3) 125 MCG (5000 UT) TABS, Take 5,000 Units by mouth at bedtime., Disp: , Rfl:    Cholecalciferol 25 MCG (1000 UT) tablet, Take 2 tablets by mouth daily., Disp: , Rfl:    colchicine 0.6 MG tablet, Take 0.6 mg by mouth daily as needed (for flare up). , Disp: , Rfl:    Cranberry 500 MG CAPS, Take 500 mg by mouth daily after breakfast., Disp: , Rfl:    cyclobenzaprine (FLEXERIL) 10 MG tablet, Take 10 mg by mouth See admin instructions. Take one tablet (10 mg) by mouth daily at bedtime, may also take one tablet (10 mg) daily as needed for pain, Disp: , Rfl:    cyclobenzaprine (FLEXERIL) 5 MG tablet, Take 1 tablet by mouth daily as needed., Disp: , Rfl:    diphenhydrAMINE (BENADRYL) 12.5 MG/5ML liquid, Take 12.5 mg by mouth daily as needed for itching or allergies., Disp: , Rfl:    levothyroxine (SYNTHROID) 112 MCG tablet, Take 112 mcg by mouth daily before breakfast., Disp: , Rfl:    levothyroxine (SYNTHROID) 112 MCG tablet, TAKE  ONE TABLET BY MOUTH EVERY DAY FOR THYROID, Disp: , Rfl:    losartan (COZAAR) 25 MG tablet, losartan 25 mg tablet  Take 1 tablet every day by oral route., Disp: , Rfl:    losartan (COZAAR) 25 MG tablet, TAKE ONE TABLET BY MOUTH ONCE EVERY DAY FOR BLOOD PRESSURE, Disp: , Rfl:    Omega-3 Fatty Acids (FISH OIL) 1000 MG CAPS, Take 1 capsule by mouth at bedtime., Disp: , Rfl:    Probiotic Product (PROBIOTIC PO), Take 1 tablet by mouth 3 (three) times a week., Disp: , Rfl:    RABEprazole (ACIPHEX) 20 MG tablet, TAKE ONE TABLET BY MOUTH IN THE MORNING AS NEEDED, Disp: , Rfl:    traMADol (ULTRAM) 50 MG tablet, Take 50 mg by mouth every 8 (eight) hours as needed (pain)., Disp: , Rfl:    TURMERIC PO, Take 1,500 mg by mouth daily., Disp: , Rfl:   Allergies  Allergen Reactions   Ceclor [Cefaclor]  Shortness Of Breath, Itching and Rash   Metformin And Related Shortness Of Breath, Rash and Other (See Comments)    Fatigue, scalp rash, shakiness   Voltaren [Diclofenac] Shortness Of Breath and Swelling    Reaction to gel - throat swelling   Morphine And Related Nausea And Vomiting    Projectile vomiting   Amlodipine Swelling and Other (See Comments)    Minor shortness of breath, fatigue, headache,    Arava [Leflunomide] Nausea And Vomiting and Other (See Comments)    Chest pain, tachycardia, elevated bp   Atorvastatin Other (See Comments)    Severe joint pain and abdominal pain   Boswellia Hives   Cephalosporins Other (See Comments)    Unknown reaction   Clindamycin/Lincomycin Diarrhea    Severe diarrhea   Gemfibrozil Itching, Nausea And Vomiting and Other (See Comments)    Muscle pain, hot flashes   Lisinopril Cough   Lodine [Etodolac] Other (See Comments)    Stomach and abdominal pain, mouth ulcers   Medrol [Methylprednisolone] Other (See Comments)    Stomach pain/ tachycardia   Methotrexate Derivatives Other (See Comments)    Fatigue/ GI symptoms/ headache/ muscle pain/ face and scalp rash, mouth ulcers   Niacin And Related Other (See Comments)    Flushing, heartburn, insomnia, joint pain, elevated heart rate   Plaquenil [Hydroxychloroquine] Itching and Other (See Comments)    Blisters on scalp    Pravastatin Other (See Comments)    Muscle pain   Rayos [Prednisone] Other (See Comments)    Stomach pain, tachycardia, muscle spasms, parathesia   Sulfa Antibiotics Other (See Comments)    Unknown reaction   Unasyn [Ampicillin-Sulbactam Sodium] Itching and Other (See Comments)    Blisters on scalp    Zocor [Simvastatin] Diarrhea and Other (See Comments)    Abdominal pain,    Actemra [Tocilizumab] Rash and Other (See Comments)    Facial rash, rash at injection site, rectal bleeding, headache   Baclofen Rash    Petechial face rash   Enbrel [Etanercept] Rash and Other (See  Comments)    Redness, swelling at injection site, tightness in throat w/ mild respiratory distress, muscle spasms, bell's palsy, discoid lupus   Ibuprofen Swelling and Rash    Swollen face   Other Itching, Rash and Other (See Comments)    monsel solution caused rash, itching   Penicillins Rash    Did it involve swelling of the face/tongue/throat, SOB, or low BP? YES Did it involve sudden or severe rash/hives, skin peeling, or any reaction  on the inside of your mouth or nose? NO Did you need to seek medical attention at a hospital or doctor's office? YES When did it last happen?   04/2020 If all above answers are "NO", may proceed with cephalosporin use.   Simponi [Golimumab] Hives, Itching and Rash   Zetia [Ezetimibe] Rash, Other (See Comments) and Cough    Headache, itchy rash, fatigue, dry mouth, dry cough   Review of Systems Objective:  There were no vitals filed for this visit.  General: Well developed, nourished, in no acute distress, alert and oriented x3   Dermatological: Skin is warm, dry and supple bilateral. Nails x 10 are well maintained; remaining integument appears unremarkable at this time. There are no open sores, no preulcerative lesions, no rash or signs of infection present.  Vascular: Dorsalis Pedis artery and Posterior Tibial artery pedal pulses are 2/4 bilateral with immedate capillary fill time. Pedal hair growth present. No varicosities and no lower extremity edema present bilateral.   Neruologic: Grossly intact via light touch bilateral. Vibratory intact via tuning fork bilateral. Protective threshold with Semmes Wienstein monofilament intact to all pedal sites bilateral. Patellar and Achilles deep tendon reflexes 2+ bilateral. No Babinski or clonus noted bilateral.   Musculoskeletal: No gross boney pedal deformities bilateral. No pain, crepitus, or limitation noted with foot and ankle range of motion bilateral. Muscular strength 5/5 in all groups tested  bilateral.  Gait: Unassisted, Nonantalgic.    Radiographs:  Right foot demonstrates an osseously mature individual with mild metatarsus adductus and increase in the first intermetatarsal angle.  This demonstrates an artificially increased in size bunion deformity.  She does have posterior and plantar calcaneal heel spurs.  Otherwise the foot appears to be rectus and in good position.  Left foot demonstrates a rectus foot mild metatarsus adductus very mild tailor's bunion deformity plantar distally oriented calcaneal heel spurs as well as of the posterior proximally or ankle can heel spur no calcification of the arteries that I can see no cutis calcinosis.  Mild edema in the posterior leg compartment.  Assessment & Plan:   Assessment: hallux valgus hx gout neuropathy.  Plan: follow up as needed.     Andraya Frigon T. [place], [place]

## 2021-08-20 ENCOUNTER — Telehealth: Payer: Self-pay

## 2021-08-20 NOTE — Telephone Encounter (Signed)
Patient called needing lab results printed for her to pick up and take to the [place] from 02/2020. I informed her I printed them and will have them at the front desk for her in the [place] office.   Kristy Gill [phone]

## 2023-01-24 ENCOUNTER — Other Ambulatory Visit: Payer: Self-pay | Admitting: Internal Medicine

## 2023-01-24 DIAGNOSIS — Z1231 Encounter for screening mammogram for malignant neoplasm of breast: Secondary | ICD-10-CM

## 2023-02-04 ENCOUNTER — Ambulatory Visit
Admission: RE | Admit: 2023-02-04 | Discharge: 2023-02-04 | Discharge status: Home / Self Care | Payer: No Typology Code available for payment source | Source: Ambulatory Visit | Attending: Internal Medicine

## 2023-02-04 DIAGNOSIS — Z1231 Encounter for screening mammogram for malignant neoplasm of breast: Secondary | ICD-10-CM

## 2023-02-28 DIAGNOSIS — E063 Autoimmune thyroiditis: Secondary | ICD-10-CM | POA: Diagnosis not present

## 2023-02-28 DIAGNOSIS — Z9109 Other allergy status, other than to drugs and biological substances: Secondary | ICD-10-CM | POA: Diagnosis not present

## 2023-02-28 DIAGNOSIS — L508 Other urticaria: Secondary | ICD-10-CM | POA: Diagnosis not present

## 2023-02-28 DIAGNOSIS — G4733 Obstructive sleep apnea (adult) (pediatric): Secondary | ICD-10-CM | POA: Diagnosis not present

## 2023-02-28 DIAGNOSIS — Z9989 Dependence on other enabling machines and devices: Secondary | ICD-10-CM | POA: Diagnosis not present

## 2023-02-28 DIAGNOSIS — E118 Type 2 diabetes mellitus with unspecified complications: Secondary | ICD-10-CM | POA: Diagnosis not present

## 2023-02-28 DIAGNOSIS — M159 Polyosteoarthritis, unspecified: Secondary | ICD-10-CM | POA: Diagnosis not present

## 2023-02-28 DIAGNOSIS — M069 Rheumatoid arthritis, unspecified: Secondary | ICD-10-CM | POA: Diagnosis not present

## 2023-03-12 DIAGNOSIS — E669 Obesity, unspecified: Secondary | ICD-10-CM | POA: Diagnosis not present

## 2023-03-12 DIAGNOSIS — M79671 Pain in right foot: Secondary | ICD-10-CM | POA: Diagnosis not present

## 2023-03-12 DIAGNOSIS — M1A09X0 Idiopathic chronic gout, multiple sites, without tophus (tophi): Secondary | ICD-10-CM | POA: Diagnosis not present

## 2023-03-12 DIAGNOSIS — M7662 Achilles tendinitis, left leg: Secondary | ICD-10-CM | POA: Diagnosis not present

## 2023-03-12 DIAGNOSIS — M1991 Primary osteoarthritis, unspecified site: Secondary | ICD-10-CM | POA: Diagnosis not present

## 2023-03-12 DIAGNOSIS — Z6834 Body mass index (BMI) 34.0-34.9, adult: Secondary | ICD-10-CM | POA: Diagnosis not present

## 2023-03-12 DIAGNOSIS — M0609 Rheumatoid arthritis without rheumatoid factor, multiple sites: Secondary | ICD-10-CM | POA: Diagnosis not present

## 2023-04-01 DIAGNOSIS — H5789 Other specified disorders of eye and adnexa: Secondary | ICD-10-CM | POA: Diagnosis not present

## 2023-04-01 DIAGNOSIS — M8580 Other specified disorders of bone density and structure, unspecified site: Secondary | ICD-10-CM | POA: Diagnosis not present

## 2023-04-01 DIAGNOSIS — G4733 Obstructive sleep apnea (adult) (pediatric): Secondary | ICD-10-CM | POA: Diagnosis not present

## 2023-04-08 ENCOUNTER — Other Ambulatory Visit: Payer: Self-pay | Admitting: Family Medicine

## 2023-04-08 ENCOUNTER — Encounter: Payer: Self-pay | Admitting: Family Medicine

## 2023-04-08 DIAGNOSIS — M8580 Other specified disorders of bone density and structure, unspecified site: Secondary | ICD-10-CM

## 2023-04-08 DIAGNOSIS — Z1231 Encounter for screening mammogram for malignant neoplasm of breast: Secondary | ICD-10-CM

## 2023-04-29 DIAGNOSIS — H4321 Crystalline deposits in vitreous body, right eye: Secondary | ICD-10-CM | POA: Diagnosis not present

## 2023-04-29 DIAGNOSIS — H5203 Hypermetropia, bilateral: Secondary | ICD-10-CM | POA: Diagnosis not present

## 2023-04-29 DIAGNOSIS — H25041 Posterior subcapsular polar age-related cataract, right eye: Secondary | ICD-10-CM | POA: Diagnosis not present

## 2023-05-04 ENCOUNTER — Encounter (HOSPITAL_COMMUNITY): Payer: Self-pay

## 2023-05-04 ENCOUNTER — Emergency Department (HOSPITAL_COMMUNITY): Payer: No Typology Code available for payment source | Admitting: Certified Registered"

## 2023-05-04 ENCOUNTER — Inpatient Hospital Stay (HOSPITAL_COMMUNITY): Payer: No Typology Code available for payment source

## 2023-05-04 ENCOUNTER — Emergency Department (HOSPITAL_COMMUNITY): Payer: No Typology Code available for payment source

## 2023-05-04 ENCOUNTER — Other Ambulatory Visit: Payer: Self-pay

## 2023-05-04 ENCOUNTER — Encounter (HOSPITAL_COMMUNITY)
Admission: EM | Disposition: A | Discharge status: Home / Self Care | Payer: Self-pay | Source: Home / Self Care | Attending: Internal Medicine

## 2023-05-04 ENCOUNTER — Inpatient Hospital Stay (HOSPITAL_COMMUNITY)
Admission: EM | Admit: 2023-05-04 | Discharge: 2023-05-05 | DRG: 483 | Disposition: A | Discharge status: Home / Self Care | Payer: No Typology Code available for payment source | Attending: Student in an Organized Health Care Education/Training Program | Admitting: Student in an Organized Health Care Education/Training Program

## 2023-05-04 DIAGNOSIS — Z6833 Body mass index (BMI) 33.0-33.9, adult: Secondary | ICD-10-CM

## 2023-05-04 DIAGNOSIS — E669 Obesity, unspecified: Secondary | ICD-10-CM | POA: Diagnosis not present

## 2023-05-04 DIAGNOSIS — Z96611 Presence of right artificial shoulder joint: Secondary | ICD-10-CM

## 2023-05-04 DIAGNOSIS — Z79899 Other long term (current) drug therapy: Secondary | ICD-10-CM

## 2023-05-04 DIAGNOSIS — I480 Paroxysmal atrial fibrillation: Secondary | ICD-10-CM

## 2023-05-04 DIAGNOSIS — Z79890 Hormone replacement therapy (postmenopausal): Secondary | ICD-10-CM | POA: Diagnosis not present

## 2023-05-04 DIAGNOSIS — Y9222 Religious institution as the place of occurrence of the external cause: Secondary | ICD-10-CM

## 2023-05-04 DIAGNOSIS — I152 Hypertension secondary to endocrine disorders: Secondary | ICD-10-CM | POA: Diagnosis not present

## 2023-05-04 DIAGNOSIS — I119 Hypertensive heart disease without heart failure: Secondary | ICD-10-CM | POA: Diagnosis not present

## 2023-05-04 DIAGNOSIS — E782 Mixed hyperlipidemia: Secondary | ICD-10-CM | POA: Diagnosis present

## 2023-05-04 DIAGNOSIS — S42291A Other displaced fracture of upper end of right humerus, initial encounter for closed fracture: Secondary | ICD-10-CM | POA: Diagnosis not present

## 2023-05-04 DIAGNOSIS — E039 Hypothyroidism, unspecified: Secondary | ICD-10-CM | POA: Diagnosis present

## 2023-05-04 DIAGNOSIS — Z833 Family history of diabetes mellitus: Secondary | ICD-10-CM

## 2023-05-04 DIAGNOSIS — Z886 Allergy status to analgesic agent status: Secondary | ICD-10-CM | POA: Diagnosis not present

## 2023-05-04 DIAGNOSIS — S42201A Unspecified fracture of upper end of right humerus, initial encounter for closed fracture: Secondary | ICD-10-CM | POA: Diagnosis not present

## 2023-05-04 DIAGNOSIS — M069 Rheumatoid arthritis, unspecified: Secondary | ICD-10-CM | POA: Diagnosis present

## 2023-05-04 DIAGNOSIS — E1165 Type 2 diabetes mellitus with hyperglycemia: Secondary | ICD-10-CM | POA: Diagnosis not present

## 2023-05-04 DIAGNOSIS — G4733 Obstructive sleep apnea (adult) (pediatric): Secondary | ICD-10-CM | POA: Diagnosis present

## 2023-05-04 DIAGNOSIS — E079 Disorder of thyroid, unspecified: Secondary | ICD-10-CM | POA: Diagnosis present

## 2023-05-04 DIAGNOSIS — M810 Age-related osteoporosis without current pathological fracture: Secondary | ICD-10-CM | POA: Diagnosis present

## 2023-05-04 DIAGNOSIS — S80212A Abrasion, left knee, initial encounter: Secondary | ICD-10-CM | POA: Diagnosis present

## 2023-05-04 DIAGNOSIS — G4730 Sleep apnea, unspecified: Secondary | ICD-10-CM | POA: Diagnosis present

## 2023-05-04 DIAGNOSIS — Z96691 Finger-joint replacement of right hand: Secondary | ICD-10-CM | POA: Diagnosis present

## 2023-05-04 DIAGNOSIS — G4736 Sleep related hypoventilation in conditions classified elsewhere: Secondary | ICD-10-CM | POA: Diagnosis present

## 2023-05-04 DIAGNOSIS — W19XXXA Unspecified fall, initial encounter: Secondary | ICD-10-CM | POA: Diagnosis not present

## 2023-05-04 DIAGNOSIS — Z8249 Family history of ischemic heart disease and other diseases of the circulatory system: Secondary | ICD-10-CM | POA: Diagnosis not present

## 2023-05-04 DIAGNOSIS — K219 Gastro-esophageal reflux disease without esophagitis: Secondary | ICD-10-CM | POA: Diagnosis present

## 2023-05-04 DIAGNOSIS — E1159 Type 2 diabetes mellitus with other circulatory complications: Secondary | ICD-10-CM | POA: Diagnosis present

## 2023-05-04 DIAGNOSIS — S0081XA Abrasion of other part of head, initial encounter: Secondary | ICD-10-CM | POA: Diagnosis present

## 2023-05-04 DIAGNOSIS — S42351A Displaced comminuted fracture of shaft of humerus, right arm, initial encounter for closed fracture: Secondary | ICD-10-CM | POA: Diagnosis not present

## 2023-05-04 DIAGNOSIS — D849 Immunodeficiency, unspecified: Secondary | ICD-10-CM | POA: Diagnosis present

## 2023-05-04 DIAGNOSIS — Z96651 Presence of right artificial knee joint: Secondary | ICD-10-CM | POA: Diagnosis present

## 2023-05-04 DIAGNOSIS — Z881 Allergy status to other antibiotic agents status: Secondary | ICD-10-CM

## 2023-05-04 DIAGNOSIS — E119 Type 2 diabetes mellitus without complications: Secondary | ICD-10-CM

## 2023-05-04 DIAGNOSIS — G4739 Other sleep apnea: Secondary | ICD-10-CM | POA: Diagnosis present

## 2023-05-04 DIAGNOSIS — Z882 Allergy status to sulfonamides status: Secondary | ICD-10-CM

## 2023-05-04 DIAGNOSIS — I10 Essential (primary) hypertension: Secondary | ICD-10-CM | POA: Diagnosis present

## 2023-05-04 DIAGNOSIS — Z880 Allergy status to penicillin: Secondary | ICD-10-CM

## 2023-05-04 DIAGNOSIS — W1830XA Fall on same level, unspecified, initial encounter: Secondary | ICD-10-CM | POA: Diagnosis present

## 2023-05-04 DIAGNOSIS — Z888 Allergy status to other drugs, medicaments and biological substances status: Secondary | ICD-10-CM

## 2023-05-04 HISTORY — PX: REVERSE SHOULDER ARTHROPLASTY: SHX5054

## 2023-05-04 LAB — CBC
HCT: 36.6 % (ref 36.0–46.0)
Hemoglobin: 11.7 g/dL — ABNORMAL LOW (ref 12.0–15.0)
MCH: 28.1 pg (ref 26.0–34.0)
MCHC: 32 g/dL (ref 30.0–36.0)
MCV: 87.8 fL (ref 80.0–100.0)
Platelets: 240 10*3/uL (ref 150–400)
RBC: 4.17 MIL/uL (ref 3.87–5.11)
RDW: 14 % (ref 11.5–15.5)
WBC: 16.4 10*3/uL — ABNORMAL HIGH (ref 4.0–10.5)
nRBC: 0 % (ref 0.0–0.2)

## 2023-05-04 LAB — CREATININE, SERUM
Creatinine, Ser: 0.91 mg/dL (ref 0.44–1.00)
GFR, Estimated: >60 mL/min (ref >60)

## 2023-05-04 SURGERY — ARTHROPLASTY, SHOULDER, TOTAL, REVERSE
Anesthesia: General | Site: Shoulder | Laterality: Right

## 2023-05-04 MED ORDER — SODIUM CHLORIDE 0.9 % IV SOLN: INTRAVENOUS | Status: DC

## 2023-05-04 MED ORDER — ALLOPURINOL 100 MG PO TABS: 200.0000 mg | ORAL_TABLET | Freq: Every day | ORAL | Status: DC

## 2023-05-04 MED ORDER — BUPIVACAINE-EPINEPHRINE (PF) 0.25% -1:200000 IJ SOLN
INTRAMUSCULAR | Status: DC | PRN
  Administered 2023-05-04: 10 mL

## 2023-05-04 MED ORDER — ACETAMINOPHEN 500 MG PO TABS
1000.0000 mg | ORAL_TABLET | Freq: Every day | ORAL | Status: DC
  Administered 2023-05-04: 1000 mg via ORAL
  Filled 2023-05-04: qty 2

## 2023-05-04 MED ORDER — DIPHENHYDRAMINE HCL 12.5 MG/5ML PO ELIX: 12.5000 mg | ORAL_SOLUTION | Freq: Every day | ORAL | Status: DC | PRN

## 2023-05-04 MED ORDER — TRAMADOL HCL 50 MG PO TABS: 50.0000 mg | ORAL_TABLET | Freq: Three times a day (TID) | ORAL | 0 refills | Status: AC | PRN

## 2023-05-04 MED ORDER — LIDOCAINE 2% (20 MG/ML) 5 ML SYRINGE
INTRAMUSCULAR | Status: DC | PRN
  Administered 2023-05-04: 60 mg via INTRAVENOUS

## 2023-05-04 MED ORDER — ONDANSETRON HCL 4 MG/2ML IJ SOLN
INTRAMUSCULAR | Status: DC | PRN
  Administered 2023-05-04: 4 mg via INTRAVENOUS

## 2023-05-04 MED ORDER — ACETAMINOPHEN 500 MG PO TABS: 1000.0000 mg | ORAL_TABLET | Freq: Every day | ORAL | Status: DC | PRN

## 2023-05-04 MED ORDER — CHOLECALCIFEROL 25 MCG (1000 UT) PO TABS: 2.0000 | ORAL_TABLET | Freq: Every day | ORAL | Status: DC

## 2023-05-04 MED ORDER — ONDANSETRON HCL 4 MG/2ML IJ SOLN: 4.0000 mg | Freq: Four times a day (QID) | INTRAMUSCULAR | Status: DC | PRN

## 2023-05-04 MED ORDER — FENTANYL CITRATE (PF) 100 MCG/2ML IJ SOLN
INTRAMUSCULAR | Status: DC | PRN
  Administered 2023-05-04 (×2): 50 ug via INTRAVENOUS

## 2023-05-04 MED ORDER — TRANEXAMIC ACID-NACL 1000-0.7 MG/100ML-% IV SOLN
INTRAVENOUS | Status: AC
  Filled 2023-05-04: qty 100

## 2023-05-04 MED ORDER — POLYVINYL ALCOHOL 1.4 % OP SOLN: 1.0000 [drp] | Freq: Four times a day (QID) | OPHTHALMIC | Status: DC | PRN

## 2023-05-04 MED ORDER — MIDAZOLAM HCL 2 MG/2ML IJ SOLN
INTRAMUSCULAR | Status: AC
  Filled 2023-05-04: qty 2

## 2023-05-04 MED ORDER — ACETAMINOPHEN 325 MG PO TABS: 325.0000 mg | ORAL_TABLET | ORAL | Status: DC | PRN

## 2023-05-04 MED ORDER — DOCUSATE SODIUM 100 MG PO CAPS
100.0000 mg | ORAL_CAPSULE | Freq: Two times a day (BID) | ORAL | Status: DC
  Administered 2023-05-04: 100 mg via ORAL
  Filled 2023-05-04 (×2): qty 1

## 2023-05-04 MED ORDER — ONDANSETRON HCL 4 MG PO TABS: 4.0000 mg | ORAL_TABLET | Freq: Four times a day (QID) | ORAL | Status: DC | PRN

## 2023-05-04 MED ORDER — TRANEXAMIC ACID-NACL 1000-0.7 MG/100ML-% IV SOLN
1000.0000 mg | INTRAVENOUS | Status: AC
  Administered 2023-05-04: 1000 mg via INTRAVENOUS

## 2023-05-04 MED ORDER — BUPIVACAINE-EPINEPHRINE 0.25% -1:200000 IJ SOLN
INTRAMUSCULAR | Status: AC
  Filled 2023-05-04: qty 1

## 2023-05-04 MED ORDER — OYSTER SHELL CALCIUM/D3 500-5 MG-MCG PO TABS
1.0000 | ORAL_TABLET | Freq: Two times a day (BID) | ORAL | Status: DC
  Filled 2023-05-04: qty 1

## 2023-05-04 MED ORDER — FENTANYL CITRATE PF 50 MCG/ML IJ SOSY: 25.0000 ug | PREFILLED_SYRINGE | INTRAMUSCULAR | Status: DC | PRN

## 2023-05-04 MED ORDER — LACTATED RINGERS IV SOLN: INTRAVENOUS | Status: DC | PRN

## 2023-05-04 MED ORDER — BISACODYL 10 MG RE SUPP: 10.0000 mg | Freq: Every day | RECTAL | Status: DC | PRN

## 2023-05-04 MED ORDER — 0.9 % SODIUM CHLORIDE (POUR BTL) OPTIME
TOPICAL | Status: DC | PRN
  Administered 2023-05-04: 1000 mL

## 2023-05-04 MED ORDER — ROCURONIUM BROMIDE 10 MG/ML (PF) SYRINGE
PREFILLED_SYRINGE | INTRAVENOUS | Status: AC
  Filled 2023-05-04: qty 10

## 2023-05-04 MED ORDER — AMISULPRIDE (ANTIEMETIC) 5 MG/2ML IV SOLN: 10.0000 mg | Freq: Once | INTRAVENOUS | Status: DC | PRN

## 2023-05-04 MED ORDER — SACCHAROMYCES BOULARDII 250 MG PO CAPS
250.0000 mg | ORAL_CAPSULE | ORAL | Status: DC
  Filled 2023-05-04: qty 1

## 2023-05-04 MED ORDER — PROPOFOL 10 MG/ML IV BOLUS
INTRAVENOUS | Status: DC | PRN
Start: 2023-05-04 — End: 2023-05-04
  Administered 2023-05-04: 150 mg via INTRAVENOUS

## 2023-05-04 MED ORDER — STERILE WATER FOR IRRIGATION IR SOLN
Status: DC | PRN
  Administered 2023-05-04: 1000 mL

## 2023-05-04 MED ORDER — PANTOPRAZOLE SODIUM 40 MG PO TBEC
40.0000 mg | DELAYED_RELEASE_TABLET | Freq: Every day | ORAL | Status: DC
  Filled 2023-05-04: qty 1

## 2023-05-04 MED ORDER — OXYCODONE HCL 5 MG PO TABS: 5.0000 mg | ORAL_TABLET | Freq: Once | ORAL | Status: DC | PRN

## 2023-05-04 MED ORDER — PHENYLEPHRINE HCL-NACL 20-0.9 MG/250ML-% IV SOLN
INTRAVENOUS | Status: AC
  Filled 2023-05-04: qty 250

## 2023-05-04 MED ORDER — ALBUTEROL SULFATE (2.5 MG/3ML) 0.083% IN NEBU: 3.0000 mL | INHALATION_SOLUTION | RESPIRATORY_TRACT | Status: DC | PRN

## 2023-05-04 MED ORDER — CYCLOBENZAPRINE HCL 5 MG PO TABS: 5.0000 mg | ORAL_TABLET | Freq: Three times a day (TID) | ORAL | Status: DC | PRN

## 2023-05-04 MED ORDER — CRANBERRY 500 MG PO CAPS: 500.0000 mg | ORAL_CAPSULE | Freq: Every day | ORAL | Status: DC

## 2023-05-04 MED ORDER — MENTHOL 3 MG MT LOZG: 1.0000 | LOZENGE | OROMUCOSAL | Status: DC | PRN

## 2023-05-04 MED ORDER — TRAMADOL HCL 50 MG PO TABS
50.0000 mg | ORAL_TABLET | Freq: Three times a day (TID) | ORAL | Status: DC | PRN
  Administered 2023-05-04 – 2023-05-05 (×2): 50 mg via ORAL
  Filled 2023-05-04 (×2): qty 1

## 2023-05-04 MED ORDER — CEFAZOLIN IN SODIUM CHLORIDE 3-0.9 GM/100ML-% IV SOLN
3.0000 g | INTRAVENOUS | Status: DC
  Administered 2023-05-04: 2 g via INTRAVENOUS

## 2023-05-04 MED ORDER — LIDOCAINE HCL (PF) 2 % IJ SOLN
INTRAMUSCULAR | Status: AC
  Filled 2023-05-04: qty 5

## 2023-05-04 MED ORDER — ONDANSETRON HCL 4 MG/2ML IJ SOLN
INTRAMUSCULAR | Status: AC
  Filled 2023-05-04: qty 2

## 2023-05-04 MED ORDER — METOCLOPRAMIDE HCL 5 MG PO TABS: 5.0000 mg | ORAL_TABLET | Freq: Three times a day (TID) | ORAL | Status: DC | PRN

## 2023-05-04 MED ORDER — ACETAMINOPHEN 160 MG/5ML PO SOLN: 325.0000 mg | ORAL | Status: DC | PRN

## 2023-05-04 MED ORDER — METOCLOPRAMIDE HCL 5 MG/ML IJ SOLN: 5.0000 mg | Freq: Three times a day (TID) | INTRAMUSCULAR | Status: DC | PRN

## 2023-05-04 MED ORDER — CYCLOBENZAPRINE HCL 10 MG PO TABS
5.0000 mg | ORAL_TABLET | Freq: Once | ORAL | Status: AC
  Administered 2023-05-04: 5 mg via ORAL
  Filled 2023-05-04: qty 1

## 2023-05-04 MED ORDER — CYCLOBENZAPRINE HCL 10 MG PO TABS
10.0000 mg | ORAL_TABLET | Freq: Every day | ORAL | Status: DC
  Administered 2023-05-04: 10 mg via ORAL
  Filled 2023-05-04: qty 1

## 2023-05-04 MED ORDER — LEVOTHYROXINE SODIUM 112 MCG PO TABS: 112.0000 ug | ORAL_TABLET | Freq: Every day | ORAL | Status: DC

## 2023-05-04 MED ORDER — VITAMIN D 25 MCG (1000 UNIT) PO TABS
5000.0000 [IU] | ORAL_TABLET | Freq: Every day | ORAL | Status: DC
  Administered 2023-05-05: 5000 [IU] via ORAL
  Filled 2023-05-04: qty 5

## 2023-05-04 MED ORDER — ALLOPURINOL 100 MG PO TABS: 100.0000 mg | ORAL_TABLET | Freq: Every day | ORAL | Status: DC

## 2023-05-04 MED ORDER — COLCHICINE 0.6 MG PO TABS: 0.6000 mg | ORAL_TABLET | Freq: Every day | ORAL | Status: DC | PRN

## 2023-05-04 MED ORDER — OXYCODONE HCL 5 MG/5ML PO SOLN: 5.0000 mg | Freq: Once | ORAL | Status: DC | PRN

## 2023-05-04 MED ORDER — ROCURONIUM BROMIDE 10 MG/ML (PF) SYRINGE
PREFILLED_SYRINGE | INTRAVENOUS | Status: DC | PRN
  Administered 2023-05-04: 60 mg via INTRAVENOUS

## 2023-05-04 MED ORDER — CEFAZOLIN SODIUM-DEXTROSE 2-4 GM/100ML-% IV SOLN
2.0000 g | Freq: Four times a day (QID) | INTRAVENOUS | Status: DC
  Administered 2023-05-05 (×2): 2 g via INTRAVENOUS
  Filled 2023-05-04 (×2): qty 100

## 2023-05-04 MED ORDER — VITAMIN D 25 MCG (1000 UNIT) PO TABS: 5000.0000 [IU] | ORAL_TABLET | Freq: Every day | ORAL | Status: DC

## 2023-05-04 MED ORDER — SUGAMMADEX SODIUM 200 MG/2ML IV SOLN
INTRAVENOUS | Status: DC | PRN
  Administered 2023-05-04: 200 mg via INTRAVENOUS

## 2023-05-04 MED ORDER — DEXAMETHASONE SODIUM PHOSPHATE 10 MG/ML IJ SOLN
INTRAMUSCULAR | Status: AC
  Filled 2023-05-04: qty 1

## 2023-05-04 MED ORDER — PHENYLEPHRINE HCL-NACL 20-0.9 MG/250ML-% IV SOLN
INTRAVENOUS | Status: DC | PRN
  Administered 2023-05-04: 25 ug/min via INTRAVENOUS

## 2023-05-04 MED ORDER — TRANEXAMIC ACID-NACL 1000-0.7 MG/100ML-% IV SOLN
1000.0000 mg | Freq: Once | INTRAVENOUS | Status: AC
  Administered 2023-05-04: 1000 mg via INTRAVENOUS
  Filled 2023-05-04: qty 100

## 2023-05-04 MED ORDER — PROPOFOL 10 MG/ML IV BOLUS
INTRAVENOUS | Status: AC
  Filled 2023-05-04: qty 20

## 2023-05-04 MED ORDER — FENTANYL CITRATE (PF) 100 MCG/2ML IJ SOLN
INTRAMUSCULAR | Status: AC
  Filled 2023-05-04: qty 2

## 2023-05-04 MED ORDER — TURMERIC 500 MG PO CAPS: 1500.0000 mg | ORAL_CAPSULE | Freq: Every day | ORAL | Status: DC

## 2023-05-04 MED ORDER — ACETAMINOPHEN 10 MG/ML IV SOLN: 1000.0000 mg | Freq: Once | INTRAVENOUS | Status: DC | PRN

## 2023-05-04 MED ORDER — LACTATED RINGERS IV SOLN: INTRAVENOUS | Status: DC

## 2023-05-04 MED ORDER — PHENOL 1.4 % MT LIQD: 1.0000 | OROMUCOSAL | Status: DC | PRN

## 2023-05-04 MED ORDER — ACETAMINOPHEN 325 MG PO TABS: 325.0000 mg | ORAL_TABLET | Freq: Four times a day (QID) | ORAL | Status: DC | PRN

## 2023-05-04 MED ORDER — VANCOMYCIN HCL IN DEXTROSE 1-5 GM/200ML-% IV SOLN: 1000.0000 mg | INTRAVENOUS | Status: DC

## 2023-05-04 MED ORDER — ESMOLOL HCL 100 MG/10ML IV SOLN
INTRAVENOUS | Status: AC
  Filled 2023-05-04: qty 10

## 2023-05-04 MED ORDER — CEFAZOLIN SODIUM-DEXTROSE 2-4 GM/100ML-% IV SOLN
INTRAVENOUS | Status: AC
  Filled 2023-05-04: qty 100

## 2023-05-04 MED ORDER — PROMETHAZINE HCL 25 MG/ML IJ SOLN: 6.2500 mg | INTRAMUSCULAR | Status: DC | PRN

## 2023-05-04 MED ORDER — LOSARTAN POTASSIUM 25 MG PO TABS
25.0000 mg | ORAL_TABLET | Freq: Every day | ORAL | Status: DC
  Filled 2023-05-04: qty 1

## 2023-05-04 MED ORDER — TRAMADOL HCL 50 MG PO TABS
50.0000 mg | ORAL_TABLET | Freq: Once | ORAL | Status: AC
  Administered 2023-05-04: 50 mg via ORAL
  Filled 2023-05-04: qty 1

## 2023-05-04 MED ORDER — DEXAMETHASONE SODIUM PHOSPHATE 10 MG/ML IJ SOLN
INTRAMUSCULAR | Status: DC | PRN
  Administered 2023-05-04: 10 mg via INTRAVENOUS

## 2023-05-04 MED ORDER — POLYETHYLENE GLYCOL 3350 17 G PO PACK: 17.0000 g | PACK | Freq: Every day | ORAL | Status: DC | PRN

## 2023-05-04 MED ORDER — LOSARTAN POTASSIUM 25 MG PO TABS: 25.0000 mg | ORAL_TABLET | Freq: Every day | ORAL | Status: DC

## 2023-05-04 MED ORDER — OMEGA-3-ACID ETHYL ESTERS 1 G PO CAPS
1.0000 g | ORAL_CAPSULE | Freq: Every day | ORAL | Status: DC
  Administered 2023-05-05: 1 g via ORAL
  Filled 2023-05-04: qty 1

## 2023-05-04 MED ORDER — ENOXAPARIN SODIUM 40 MG/0.4ML IJ SOSY
40.0000 mg | PREFILLED_SYRINGE | INTRAMUSCULAR | Status: DC
  Filled 2023-05-04: qty 0.4

## 2023-05-04 MED ORDER — ESMOLOL HCL 100 MG/10ML IV SOLN
INTRAVENOUS | Status: DC | PRN
  Administered 2023-05-04: 10 mg via INTRAVENOUS

## 2023-05-04 MED ORDER — BUPIVACAINE HCL (PF) 0.5 % IJ SOLN
INTRAMUSCULAR | Status: DC | PRN
  Administered 2023-05-04: 15 mL via PERINEURAL

## 2023-05-04 MED ORDER — MORPHINE SULFATE (PF) 2 MG/ML IV SOLN: 0.5000 mg | INTRAVENOUS | Status: DC | PRN

## 2023-05-04 MED ORDER — MIDAZOLAM HCL 5 MG/5ML IJ SOLN
INTRAMUSCULAR | Status: DC | PRN
  Administered 2023-05-04 (×2): 1 mg via INTRAVENOUS

## 2023-05-04 MED ORDER — LEVOTHYROXINE SODIUM 112 MCG PO TABS
112.0000 ug | ORAL_TABLET | Freq: Every day | ORAL | Status: DC
  Administered 2023-05-05: 112 ug via ORAL
  Filled 2023-05-04: qty 1

## 2023-05-04 SURGICAL SUPPLY — 66 items
AID PSTN UNV HD RSTRNT DISP (MISCELLANEOUS) ×1
BAG COUNTER SPONGE SURGICOUNT (BAG) IMPLANT
BAG SPEC THK2 15X12 ZIP CLS (MISCELLANEOUS)
BAG SPNG CNTER NS LX DISP (BAG)
BAG ZIPLOCK 12X15 (MISCELLANEOUS) IMPLANT
BIT DRILL 1.6MX128 (BIT) IMPLANT
BIT DRILL 170X2.5X (BIT) IMPLANT
BIT DRL 170X2.5X (BIT) ×1
BLADE SAG 18X100X1.27 (BLADE) ×1 IMPLANT
COVER BACK TABLE 60X90IN (DRAPES) ×1 IMPLANT
COVER SURGICAL LIGHT HANDLE (MISCELLANEOUS) ×1 IMPLANT
DRAPE INCISE IOBAN 66X45 STRL (DRAPES) ×1 IMPLANT
DRAPE ORTHO SPLIT 77X108 STRL (DRAPES) ×2
DRAPE SHEET LG 3/4 BI-LAMINATE (DRAPES) ×1 IMPLANT
DRAPE SURG ORHT 6 SPLT 77X108 (DRAPES) ×2 IMPLANT
DRAPE TOP 10253 STERILE (DRAPES) ×1 IMPLANT
DRAPE U-SHAPE 47X51 STRL (DRAPES) ×1 IMPLANT
DRILL 2.5 (BIT) ×1
DRSG ADAPTIC 3X8 NADH LF (GAUZE/BANDAGES/DRESSINGS) ×1 IMPLANT
DURAPREP 26ML APPLICATOR (WOUND CARE) ×1 IMPLANT
ELECT BLADE TIP CTD 4 INCH (ELECTRODE) ×1 IMPLANT
ELECT NDL TIP 2.8 STRL (NEEDLE) ×1 IMPLANT
ELECT NEEDLE TIP 2.8 STRL (NEEDLE) ×1 IMPLANT
ELECT REM PT RETURN 15FT ADLT (MISCELLANEOUS) ×1 IMPLANT
FACESHIELD WRAPAROUND (MASK) ×1 IMPLANT
FACESHIELD WRAPAROUND OR TEAM (MASK) ×1 IMPLANT
GAUZE PAD ABD 8X10 STRL (GAUZE/BANDAGES/DRESSINGS) ×1 IMPLANT
GAUZE SPONGE 4X4 12PLY STRL (GAUZE/BANDAGES/DRESSINGS) ×1 IMPLANT
GLENOSPHERE DELTA XTEND LAT 38 (Miscellaneous) IMPLANT
GLOVE BIOGEL PI IND STRL 7.5 (GLOVE) ×1 IMPLANT
GLOVE BIOGEL PI IND STRL 8.5 (GLOVE) ×1 IMPLANT
GLOVE ORTHO TXT STRL SZ7.5 (GLOVE) ×1 IMPLANT
GLOVE SURG ORTHO 8.5 STRL (GLOVE) ×1 IMPLANT
GOWN STRL REUS W/ TWL XL LVL3 (GOWN DISPOSABLE) ×2 IMPLANT
GOWN STRL REUS W/TWL XL LVL3 (GOWN DISPOSABLE) ×2
KIT BASIN OR (CUSTOM PROCEDURE TRAY) ×1 IMPLANT
KIT TURNOVER KIT A (KITS) IMPLANT
MANIFOLD NEPTUNE II (INSTRUMENTS) ×1 IMPLANT
METAGLENE DELTA EXTEND (Trauma) IMPLANT
METAGLENE DXTEND (Trauma) ×1 IMPLANT
NDL MAYO CATGUT SZ4 TPR NDL (NEEDLE) IMPLANT
NEEDLE MAYO CATGUT SZ4 (NEEDLE) IMPLANT
NS IRRIG 1000ML POUR BTL (IV SOLUTION) ×1 IMPLANT
PACK SHOULDER (CUSTOM PROCEDURE TRAY) ×1 IMPLANT
PIN GUIDE 1.2 (PIN) IMPLANT
PIN GUIDE GLENOPHERE 1.5MX300M (PIN) IMPLANT
PIN METAGLENE 2.5 (PIN) IMPLANT
RESTRAINT HEAD UNIVERSAL NS (MISCELLANEOUS) ×1 IMPLANT
SCREW 4.5X36MM (Screw) IMPLANT
SLING ARM FOAM STRAP LRG (SOFTGOODS) IMPLANT
SPACER 38 PLUS 3 (Spacer) IMPLANT
SPIKE FLUID TRANSFER (MISCELLANEOUS) ×1 IMPLANT
SPONGE T-LAP 4X18 ~~LOC~~+RFID (SPONGE) IMPLANT
STEM HUM PC SZ1 RT (Stem) IMPLANT
STEM STANDARD SZ 10 113MM (Stem) ×1 IMPLANT
STEM STD SZ 10 113MM (Stem) IMPLANT
STRIP CLOSURE SKIN 1/2X4 (GAUZE/BANDAGES/DRESSINGS) ×1 IMPLANT
SUT FIBERWIRE #2 38 T-5 BLUE (SUTURE) ×7
SUT MNCRL AB 4-0 PS2 18 (SUTURE) ×1 IMPLANT
SUT VIC AB 0 CT1 36 (SUTURE) ×1 IMPLANT
SUT VIC AB 0 CT2 27 (SUTURE) ×1 IMPLANT
SUT VIC AB 2-0 CT1 27 (SUTURE) ×1
SUT VIC AB 2-0 CT1 TAPERPNT 27 (SUTURE) ×1 IMPLANT
SUTURE FIBERWR #2 38 T-5 BLUE (SUTURE) ×1 IMPLANT
TAPE PAPER 3X10 WHT MICROPORE (GAUZE/BANDAGES/DRESSINGS) IMPLANT
TOWEL OR 17X26 10 PK STRL BLUE (TOWEL DISPOSABLE) ×1 IMPLANT

## 2023-05-04 NOTE — Brief Op Note (Signed)
05/04/2023  7:24 PM  PATIENT:  Kristy Gill  72 y.o. female  PRE-OPERATIVE DIAGNOSIS:  RIGHT SHOULDER COMMINUTED AND DISPLACED PROXIMAL HUMERUS FRACTURE  POST-OPERATIVE DIAGNOSIS:  RIGHT SHOULDER COMMINUTED AND DISPLACED PROXIMAL HUMERUS FRACTURE  PROCEDURE:  Procedure(s): REVERSE SHOULDER ARTHROPLASTY (Right) DePuy delta xtend with tuberosity repair  SURGEON:  Surgeons and Role:    Beverely Low, MD - Primary  PHYSICIAN ASSISTANT:   ASSISTANTS: Thea Gist, PA-C   ANESTHESIA:   regional and general  EBL:  150 mL   BLOOD ADMINISTERED:none  DRAINS: none   LOCAL MEDICATIONS USED:  MARCAINE     SPECIMEN:  No Specimen  DISPOSITION OF SPECIMEN:  N/A  COUNTS:  YES  TOURNIQUET:  * No tourniquets in log *  DICTATION: .Other Dictation: Dictation Number [id number]  PLAN OF CARE: Admit to inpatient   PATIENT DISPOSITION:  PACU - hemodynamically stable.   Delay start of Pharmacological VTE agent (>24hrs) due to surgical blood loss or risk of bleeding: not applicable

## 2023-05-04 NOTE — Anesthesia Postprocedure Evaluation (Signed)
Anesthesia Post Note  Patient: Kristy Gill  Procedure(s) Performed: REVERSE SHOULDER ARTHROPLASTY (Right: Shoulder)     Patient location during evaluation: PACU Anesthesia Type: General and Regional Level of consciousness: awake and alert Pain management: pain level controlled Vital Signs Assessment: post-procedure vital signs reviewed and stable Respiratory status: spontaneous breathing, nonlabored ventilation, respiratory function stable and patient connected to nasal cannula oxygen Cardiovascular status: blood pressure returned to baseline and stable Postop Assessment: no apparent nausea or vomiting Anesthetic complications: no  No notable events documented.  Last Vitals:  Vitals:   05/04/23 2010 05/04/23 2015  BP:  (!) 156/66  Pulse: (!) 109 (!) 114  Resp: (!) 25 18  Temp:  36.8 C  SpO2:  97%    Last Pain:  Vitals:   05/04/23 1952  TempSrc:   PainSc: 0-No pain                 Shelton Silvas

## 2023-05-04 NOTE — ED Provider Notes (Signed)
[place] EMERGENCY DEPARTMENT AT [facility] Provider Note   CSN: [id number] Arrival date & time: 05/04/23  1143     History  Chief Complaint  Patient presents with   Kristy Gill is a 72 y.o. female with a past medical history of diabetes, hyperlipidemia, hypertension who presents emergency department brought in by EMS with concerns for mechanical fall onset prior to arrival.  Patient notes that she was walking on the sidewalk when her foot became stuck in an opening on the sidewalk which caused her to fall and land on her face and right shoulder.  Per PECARN, patient was unable to tolerate c-collar.  Patient denies anticoagulants at this time.  Has associated right shoulder pain, right elbow pain, right wrist/hand pain, left knee abrasion/pain, abrasions to face.  Denies neck pain, back pain, chest pain, shortness of breath, nausea, vomiting, LOC.  The history is provided by the patient. No language interpreter was used.       Home Medications Prior to Admission medications   Medication Sig Start Date End Date Taking? Authorizing Provider  acetaminophen (TYLENOL) 500 MG tablet Take 1,000 mg by mouth daily as needed (pain).    [provider]  acetaminophen (TYLENOL) 650 MG CR tablet Take 1,300 mg by mouth at bedtime.    [provider]  albuterol (VENTOLIN HFA) 108 (90 Base) MCG/ACT inhaler Inhale 2 puffs into the lungs every 4-6 hours as needed for cough, wheeze, shortness of breath or chest tightness. 03/10/20   Marcelyn Bruins, MD  allopurinol (ZYLOPRIM) 100 MG tablet Take 100 mg by mouth daily with supper.    [provider]  allopurinol (ZYLOPRIM) 100 MG tablet Take 2 tablets by mouth daily. 08/21/20   [provider]  calcium-vitamin D (OSCAL WITH D) 500-200 MG-UNIT TABS tablet Take 1 tablet by mouth 2 (two) times daily. 04/30/06   [provider]  carboxymethylcellulose (REFRESH PLUS) 0.5 % SOLN INSTILL 1  DROP IN [facility] EYE FOUR TIMES A DAY 07/26/20   [provider]  Cholecalciferol (VITAMIN D-3) 125 MCG (5000 UT) TABS Take 5,000 Units by mouth at bedtime.    [provider]  Cholecalciferol 25 MCG (1000 UT) tablet Take 2 tablets by mouth daily. 03/14/16   [provider]  colchicine 0.6 MG tablet Take 0.6 mg by mouth daily as needed (for flare up).     [provider]  Cranberry 500 MG CAPS Take 500 mg by mouth daily after breakfast.    [provider]  cyclobenzaprine (FLEXERIL) 10 MG tablet Take 10 mg by mouth See admin instructions. Take one tablet (10 mg) by mouth daily at bedtime, may also take one tablet (10 mg) daily as needed for pain    [provider]  cyclobenzaprine (FLEXERIL) 5 MG tablet Take 1 tablet by mouth daily as needed. 01/18/20   [provider]  diphenhydrAMINE (BENADRYL) 12.5 MG/5ML liquid Take 12.5 mg by mouth daily as needed for itching or allergies.    [provider]  levothyroxine (SYNTHROID) 112 MCG tablet Take 112 mcg by mouth daily before breakfast.    [provider]  levothyroxine (SYNTHROID) 112 MCG tablet TAKE ONE TABLET BY MOUTH EVERY DAY FOR THYROID 04/25/21   [provider]  losartan (COZAAR) 25 MG tablet losartan 25 mg tablet  Take 1 tablet every day by oral route.    [provider]  losartan (COZAAR) 25 MG tablet TAKE ONE TABLET BY MOUTH  ONCE EVERY DAY FOR BLOOD PRESSURE 06/01/21   [provider]  Omega-3 Fatty Acids (FISH OIL) 1000 MG CAPS Take 1 capsule by mouth at bedtime. 03/23/07   [provider]  Probiotic Product (PROBIOTIC PO) Take 1 tablet by mouth 3 (three) times a week.    [provider]  RABEprazole (ACIPHEX) 20 MG tablet TAKE ONE TABLET BY MOUTH IN THE MORNING AS NEEDED 03/23/07   [provider]  traMADol (ULTRAM) 50 MG tablet Take 50 mg by mouth every 8 (eight) hours as needed (pain).    [provider]   TURMERIC PO Take 1,500 mg by mouth daily.    [provider]      Allergies    Ceclor [cefaclor], Metformin and related, Voltaren [diclofenac], Morphine and codeine, Amlodipine, Arava [leflunomide], Atorvastatin, Boswellia, Cephalosporins, Clindamycin/lincomycin, Gemfibrozil, Lisinopril, Lodine [etodolac], Medrol [methylprednisolone], Methotrexate derivatives, Niacin and related, Plaquenil [hydroxychloroquine], Pravastatin, Rayos [prednisone], Sulfa antibiotics, Unasyn [ampicillin-sulbactam sodium], Zocor [simvastatin], Actemra [tocilizumab], Baclofen, Enbrel [etanercept], Ibuprofen, Other, Penicillins, Simponi [golimumab], and Zetia [ezetimibe]    Review of Systems   Review of Systems  All other systems reviewed and are negative.   Physical Exam Updated Vital Signs BP (!) 159/83   Pulse 93   Temp 97.9 F (36.6 C) (Oral)   Resp 18   SpO2 100%  Physical Exam Vitals and nursing note reviewed.  Constitutional:      General: She is not in acute distress.    Appearance: She is not diaphoretic.  HENT:     Head: Normocephalic and atraumatic.     Mouth/Throat:     Pharynx: No oropharyngeal exudate.  Eyes:     General: No scleral icterus.    Conjunctiva/sclera: Conjunctivae normal.  Cardiovascular:     Rate and Rhythm: Normal rate and regular rhythm.     Pulses: Normal pulses.     Heart sounds: Normal heart sounds.  Pulmonary:     Effort: Pulmonary effort is normal. No respiratory distress.     Breath sounds: Normal breath sounds. No wheezing.  Abdominal:     General: Bowel sounds are normal.     Palpations: Abdomen is soft. There is no mass.     Tenderness: There is no abdominal tenderness. There is no guarding or rebound.  Musculoskeletal:        General: Normal range of motion.     Cervical back: Normal range of motion and neck supple.     Comments: No spinal tenderness to palpation.  Tenderness to palpation noted to posterior right shoulder.  No right clavicular  tenderness to palpation.  Mild tenderness to palpation noted to proximal right humerus without overlying skin changes.  Tenderness to palpation noted to medial right epicondyle.  No tenderness to palpation noted to lateral epicondyle on the right.  No appreciable tenderness to palpation noted to right forearm.  Tenderness to palpation noted to right first metacarpal and base of right first digit.  Grip strength 5/5.  Radial pulse intact.  Tenderness to palpation noted to right wrist to the distal radius.  No overlying skin changes.  Abrasion noted to left knee with full active range of motion against resistance.  Skin:    General: Skin is warm and dry.  Neurological:     Mental Status: She is alert.  Psychiatric:        Behavior: Behavior normal.     ED Results / Procedures / Treatments   Labs (all labs ordered are listed, but only abnormal results  are displayed) Labs Reviewed - No data to display  EKG None  Radiology CT Maxillofacial Wo Contrast  Result Date: 05/04/2023 CLINICAL DATA:  Trauma, fall EXAM: CT MAXILLOFACIAL WITHOUT CONTRAST TECHNIQUE: Multidetector CT imaging of the maxillofacial structures was performed. Multiplanar CT image reconstructions were also generated. RADIATION DOSE REDUCTION: This exam was performed according to the departmental dose-optimization program which includes automated exposure control, adjustment of the mA and/or kV according to patient size and/or use of iterative reconstruction technique. COMPARISON:  None Available. FINDINGS: Osseous: No recent displaced fractures are seen. There is slight motion artifact limiting evaluation of right side of mandible. Orbits: Optic globes are symmetrical. Retrobulbar soft tissues are unremarkable. Sinuses: Unremarkable. Soft tissues: Unremarkable. Limited intracranial: Unremarkable. IMPRESSION: No recent fracture is seen. There are no air-fluid levels in paranasal sinuses. No focal abnormalities are seen in orbits.  Electronically Signed   By: Ernie Avena M.D.   On: 05/04/2023 14:33   CT Cervical Spine Wo Contrast  Result Date: 05/04/2023 CLINICAL DATA:  Trauma, fall EXAM: CT CERVICAL SPINE WITHOUT CONTRAST TECHNIQUE: Multidetector CT imaging of the cervical spine was performed without intravenous contrast. Multiplanar CT image reconstructions were also generated. RADIATION DOSE REDUCTION: This exam was performed according to the departmental dose-optimization program which includes automated exposure control, adjustment of the mA and/or kV according to patient size and/or use of iterative reconstruction technique. COMPARISON:  MR cervical spine done on 10/31/2019 FINDINGS: Alignment: Alignment of posterior margins of vertebral bodies appears normal. Skull base and vertebrae: No recent fracture is seen. Degenerative changes are noted in cervical spine, most severe from C4-C7 levels. Soft tissues and spinal canal: There is extrinsic pressure over the ventral margin of thecal sac caused by posterior bony spurs from C4-C7 levels, most severe at C5-C6 and C6-C7 levels with spinal stenosis. Disc levels: There is mild encroachment of neural foramina at C3-C4 level. Moderate to marked encroachment of neural foramina is seen at C5-C6 and C6-C7 levels. Upper chest: Visualized apical portions of both lungs are unremarkable. Other: Thyroid is smaller than usual in size. IMPRESSION: No recent fracture is seen. Cervical spondylosis with spinal stenosis and encroachment of neural foramina at multiple levels as described in the body of the report. Electronically Signed   By: Ernie Avena M.D.   On: 05/04/2023 14:24   CT Head Wo Contrast  Result Date: 05/04/2023 CLINICAL DATA:  Trauma, fall EXAM: CT HEAD WITHOUT CONTRAST TECHNIQUE: Contiguous axial images were obtained from the base of the skull through the vertex without intravenous contrast. RADIATION DOSE REDUCTION: This exam was performed according to the  departmental dose-optimization program which includes automated exposure control, adjustment of the mA and/or kV according to patient size and/or use of iterative reconstruction technique. COMPARISON:  10/30/2019 FINDINGS: Brain: No acute intracranial findings are seen. There are no signs of bleeding within the cranium. Ventricles are nondilated. Cortical sulci are prominent. Vascular: Scattered arterial calcifications are seen. Skull: Unremarkable. Sinuses/Orbits: Unremarkable. Other: None. IMPRESSION: No acute intracranial findings are seen in noncontrast CT brain. Atrophy. Electronically Signed   By: Ernie Avena M.D.   On: 05/04/2023 14:19   DG Knee Complete 4 Views Left  Result Date: 05/04/2023 CLINICAL DATA:  Trauma, fall EXAM: LEFT KNEE - COMPLETE 4+ VIEW COMPARISON:  None Available. FINDINGS: No fracture or dislocation is seen. There is no effusion in suprapatellar bursa. Tiny bony spurs are seen in the medial compartment. IMPRESSION: No fracture or dislocation is seen in left knee. Electronically Signed   By:  Ernie Avena M.D.   On: 05/04/2023 13:23   DG Wrist Complete Right  Result Date: 05/04/2023 CLINICAL DATA:  Trauma, fall EXAM: RIGHT WRIST - COMPLETE 3+ VIEW COMPARISON:  None Available. FINDINGS: Chondrocalcinosis is seen. No recent displaced fracture or dislocation is seen. Small smoothly marginated calcifications adjacent to scaphoid may be residual from previous injury. Degenerative changes are noted in first carpometacarpal joint. Bony spurs are seen in interphalangeal joint of the thumb. IMPRESSION: No recent fracture or dislocation is seen. Degenerative changes are noted in multiple joints. Electronically Signed   By: Ernie Avena M.D.   On: 05/04/2023 13:16   DG Hand Complete Right  Result Date: 05/04/2023 CLINICAL DATA:  Trauma, fall EXAM: RIGHT HAND - COMPLETE 3+ VIEW COMPARISON:  None Available. FINDINGS: No recent fracture or dislocation is seen in right  hand. There is previous arthroplasty in second metacarpophalangeal joint. There is surgical fusion in first metacarpophalangeal joint. Degenerative changes are noted in first carpometacarpal joint and multiple interphalangeal joints. There are faint calcifications in costal cartilage is seen right wrist. There are small smoothly marginated calcifications adjacent to scaphoid. These findings may be residual from previous injury and degenerative arthritis. IMPRESSION: No recent fracture or dislocation is seen in right hand. Other findings as described above. Electronically Signed   By: Ernie Avena M.D.   On: 05/04/2023 13:14   DG Shoulder Right  Result Date: 05/04/2023 CLINICAL DATA:  Trauma, fall EXAM: RIGHT SHOULDER - 2+ VIEW COMPARISON:  None Available. FINDINGS: Recent comminuted fracture is seen in the head and neck of proximal right humerus. There is medial and anterior displacement of distal major fracture fragment. There are metallic densities in proximal humerus suggesting previous surgical intervention. There is no definite dislocation. IMPRESSION: Comminuted displaced fracture is seen in the head and neck of the proximal right humerus. Electronically Signed   By: Ernie Avena M.D.   On: 05/04/2023 13:12   DG Elbow Complete Right  Result Date: 05/04/2023 CLINICAL DATA:  Trauma, fall EXAM: RIGHT ELBOW - COMPLETE 3+ VIEW COMPARISON:  None Available. FINDINGS: Study is limited due to less than optimal positioning. As far as seen, no recent fracture or dislocation is seen. IMPRESSION: No displaced fracture or dislocation is seen limited views of the right elbow. Electronically Signed   By: Ernie Avena M.D.   On: 05/04/2023 13:10    Procedures Procedures    Medications Ordered in ED Medications  ceFAZolin (ANCEF) IVPB 3g/100 mL premix (has no administration in time range)  tranexamic acid (CYKLOKAPRON) IVPB 1,000 mg (has no administration in time range)  traMADol (ULTRAM)  tablet 50 mg (50 mg Oral Given 05/04/23 1317)  cyclobenzaprine (FLEXERIL) tablet 5 mg (5 mg Oral Given 05/04/23 1316)    ED Course/ Medical Decision Making/ A&P Clinical Course as of 05/04/23 1633  Sun May 04, 2023  1302 Patient reevaluated and requesting tramadol and Flexeril for her pain. [SB]  1357 Pt re-evaluated and discussed with patient imaging findings. Discussed with patient plans for discussion with orthopedist, patient agreeable at this time. [SB]  1518 Consult with orthopedist, Dr. Ranell Patrick who will come by and evaluate patient  [SB]  1539 Discussed with patient plans for evaluation by orthopedist.  Patient agreeable and appreciative at this time. [SB]    Clinical Course User Index [SB] Rodney Wigger A, PA-C  Medical Decision Making Amount and/or Complexity of Data Reviewed Radiology: ordered.  Risk Prescription drug management.   Pt with mechanical unwitnessed fall occurring prior to arrival. Vital signs pt afebrile. On exam, patient with No spinal tenderness to palpation.  Tenderness to palpation noted to posterior right shoulder.  No right clavicular tenderness to palpation.  Mild tenderness to palpation noted to proximal right humerus without overlying skin changes.  Tenderness to palpation noted to medial right epicondyle.  No tenderness to palpation noted to lateral epicondyle on the right.  No appreciable tenderness to palpation noted to right forearm.  Tenderness to palpation noted to right first metacarpal and base of right first digit.  Grip strength 5/5.  Radial pulse intact.  Tenderness to palpation noted to right wrist to the distal radius.  No overlying skin changes.  Abrasion noted to left knee with full active range of motion against resistance. No acute cardiovascular, respiratory, or abdominal exam findings. Differential diagnosis includes fracture, dislocation, contusion, herniation, sprain/strain.   Imaging: I ordered imaging studies  including  left knee, right elbow, right hand, right wrist X-ray CT head without CT cervical spine CT maxillofacial I independently visualized and interpreted imaging which showed: No acute findings noted on the left knee, right elbow, right hand, right wrist x-ray.  Right shoulder x-ray with concerns for  Comminuted displaced fracture is seen in the head and neck of the  proximal right humerus.   CT scans unremarkable without concerning findings at this time. I agree with the radiologist interpretation  Medications:  I ordered medication including tramadol, Flexeril for symptom management Reevaluation of the patient after these medicines and interventions, I reevaluated the patient and found that they have improved I have reviewed the patients home medicines and have made adjustments as needed   Consultations: I requested consultation with the orthopedist, Dr. Ranell Patrick, and discussed lab and imaging findings as well as pertinent plan - they recommend: Will evaluate the patient in the emergency department   Disposition: Presenting suspicious for fall as well as humeral head fracture.  Doubt concerns at this time for contusion or sprain. After consideration of the diagnostic results and the patients response to treatment, I feel that the patient would benefit from Admission to the hospital.  Discussed with patient plans for orthopedic evaluation in the emergency department for recommendations.  Patient agreeable at this time.  According to orthopedist note, patient will be taken to the OR later tonight for surgery repair of shoulder.  This chart was dictated using voice recognition software, Dragon. Despite the best efforts of this provider to proofread and correct errors, errors may still occur which can change documentation meaning.   Final Clinical Impression(s) / ED Diagnoses Final diagnoses:  Fall, initial encounter  Humeral head fracture, right, closed, initial encounter    Rx / DC  Orders ED Discharge Orders     None         Alysha Doolan A, PA-C 05/04/23 1636    Kommor, Wyn Forster, MD 05/05/23 1110

## 2023-05-04 NOTE — Op Note (Signed)
NAME: Kristy Gill, [place] MEDICAL RECORD NO: [id number] ACCOUNT NO: [id number] DATE OF BIRTH: 03-14-1951 FACILITY: [facility] LOCATION: [id number] PHYSICIAN: Almedia Balls. Ranell Patrick, MD  Operative Report   DATE OF PROCEDURE: 05/04/2023  PREOPERATIVE DIAGNOSIS:  Comminuted and displaced proximal humerus fracture, right shoulder.  POSTOPERATIVE DIAGNOSIS:  Comminuted and displaced proximal humerus fracture, right shoulder.  PROCEDURE PERFORMED:  Right reverse total shoulder arthroplasty using DePuy Delta Xtend prosthesis with tuberosity repair.  ATTENDING SURGEON:  Almedia Balls. Ranell Patrick, MD  ASSISTANT:  Konrad Felix Dixon, [place], who was scrubbed during the entire procedure and necessary for satisfactory completion of surgery.  ANESTHESIA:  General anesthesia plus interscalene block anesthesia was used.  ESTIMATED BLOOD LOSS:  150 mL  FLUID REPLACEMENT:  1500 mL crystalloid.  COUNTS:  Instrument counts correct.  COMPLICATIONS:  No complications.  ANTIBIOTICS:  Perioperative antibiotics were given.  INDICATIONS:  The patient is a 72 year old female who presents with a history of a hard fall today, ground level, injuring her right shoulder.  The patient presented to the [facility] Emergency Department for evaluation for suspected shoulder fracture.   X-ray showed a comminuted and displaced proximal humerus fracture, not amenable to conservative treatment or necessarily ORIF given the extreme displacement of the humeral shaft medially and anteriorly.  I discussed options with the patient,  recommending reverse shoulder replacement with tuberosity repair to restore fixed focal mechanics to the shoulder and improve quality of life and use of the shoulder postoperatively. The patient agreed to this plan.  Informed consent obtained.  The  patient did have a history of a prior right shoulder rotator cuff repair and was having active shoulder pain issues prior to her fall including loss of range of motion, function,  strength and utilization of Kinesio tape.  DESCRIPTION OF PROCEDURE:  After an adequate level of anesthesia was achieved, the patient was positioned in modified beach chair position.  Right shoulder correctly identified and sterile prep and drape performed.  Timeout called, verifying correct  patient, correct site. We entered the patient's shoulder using a standard deltopectoral approach, starting at the coracoid process and extending down to the anterior humerus.  Dissection down through subcutaneous tissues, hematoma identified.  Deltoid  and cephalic vein taken laterally, pectoralis taken medially.  Conjoined tendon identified and retracted medially.  Deep retractor was placed.  Biceps tenodesed in situ with 0 Vicryl figure-of-eight suture x 2.  We then osteotomized the lesser tuberosity  and greater tuberosity free from the humeral head.  Actually the head was free from the greater tuberosity, we just had to rongeur down some of the head that was still attached to the greater tuberosity.  The head came out in one large piece that was  used for cancellous bone graft.  Once we had debulked the tuberosity, we placed two #2 FiberWire sutures medial to the lesser tuberosity and two lateral to the greater tuberosity gaining good purchase on the rotator cuff.  The patient's rotator cuff was  scarred extensively from her prior surgery, was fairly thin, not sure how functional this, but we did go ahead and get good purchase on the tuberosities for repair.  I did free up the rotator cuff from the subdeltoid tissue and released as much scar  tissue as we could, so there would be some compliance and flexibility with that rotator cuff underneath the deltoid.  Once we had the tuberosities secured, we went ahead and went to the glenoid.  We placed our deep retractors, removed the capsule and the  labrum, so we had good exposure of the glenoid face.  We removed the remaining cartilage.  The patient did have some  advanced cartilage wear, but we did remove the remaining cartilage.  Once we found the center point for our guide pin, we placed our  guide pin bicortical.  We then reamed for the metaglene baseplate, did our peripheral hand reaming, drilled out our central peg hole and then impacted the HA coated press-fit baseplate into position.  It was centered low on the glenoid.  It had good bone  support.  We placed a 36 screw inferiorly, a 36 screw at the base of the coracoid and locked those screws into the baseplate.  We had excellent baseplate support and stability, but due to the small size of the glenoid, we could not get anterior and  posterior screws.  We then went with a 38 standard plus 0 glenosphere and placed that onto the baseplate and secured that with a screwdriver.  I did a finger sweep to make sure we had no soft tissue caught up around there that would interfere with range  of motion and we had none.  We went back to the humeral side. We reamed with hand reamers up to a size 10 intramedullary diameter. We then trialled with the 10 mm stem with the 1 right metaphysis set on the 0 setting.  We placed that in 20 degrees of  retroversion and impacted that gently.  We then reduced the shoulder with a 38+3 poly trial and we were happy with the height of the stem and the soft tissue balancing for the shoulder.  We removed the trial components, irrigated thoroughly, drilled two  1.6 mm holes medial and lateral to the biceps groove and then the shaft of the humerus to placed #2 FiberWire suture for shaft to tuberosity fixation.  Once the sutures were in, we also had an around-the-world stitch through the stem.  We then went ahead  and used available bone graft from the humeral head.  After irrigation of the canal, we used impaction grafting technique and impacted the [place] 10 stem with the 1 right fracture metaphysis and we impacted that in 20 degrees of retroversion.  We had  really good support of the  stem and stability, so we did not really need cement.  At this point, we reduced the tuberosities, felt like we could get good tuberosity repair.  We placed around-the-world stitch lateral to the greater tuberosity and medial  to the lesser tuberosity.  We then went ahead and grafted the proximal portion of the stem and then tied the tuberosities to each other with the FiberWire sutures and then did the shaft to tuberosity fixation with mattress sutures and then finally tied  the around-the-world stitch down to compress the tuberosities.  Everything moved together as a unit as I ranged her shoulder, it did not restrict range of motion significantly.  We irrigated thoroughly and then went ahead and repaired deltopectoral  interval with 0 Vicryl suture followed by 2-0 Vicryl for subcutaneous closure and staples for skin.  Sterile bandage applied and shoulder sling.  The patient transported to recovery room in stable condition.   VAI D: 05/04/2023 7:34:04 pm T: 05/04/2023 9:12:00 pm  JOB: [id number]/ [id number]

## 2023-05-04 NOTE — H&P (Signed)
History and Physical    Patient: Kristy Gill GEX:[id number] DOB: 1950/12/25 DOA: 05/04/2023 DOS: the patient was seen and examined on 05/04/2023 PCP: Aliene Beams, MD  Patient coming from: Home  Chief Complaint:  Chief Complaint  Patient presents with   Fall   HPI: Kristy Gill is a 72 y.o. female with medical history significant of rheumatoid arthritis, essential hypertension, hyperlipidemia, osteoporosis, osteoarthritis, hypothyroidism, obstructive sleep apnea, paroxysmal atrial fibrillation, GERD, who presented to the ER to ED after a fall at church today.  Patient had mechanical fall and landed on her right shoulder.  She complained of immediate pain and crunching sensation.  Patient was unable to get up and was transported to the ER.  No other source of pain.  In the ER workup showed proximal humeral fracture with displacement.  Patient is being seen at PACU postoperatively.  She has had repair of her fracture at the moment.  Due to multiple medical problems orthopedics has requested admission to medical service and they will follow.  Patient has no new complaint now.  She is still recovering from her anesthesia.  Review of Systems: As mentioned in the history of present illness. All other systems reviewed and are negative. Past Medical History:  Diagnosis Date   Arthritis    Breast disorder    Clawfoot, acquired    Diabetes mellitus without complication (HCC)    Gall stones    Gout    Hypercalcemia    Hyperlipidemia    Hypertension    Hypertensive heart disease without heart failure    Obesity    Osteoporosis    Pancreatitis    Primary osteoarthritis    Rotator cuff rupture    Thyroid disease    hypothryoidism   Past Surgical History:  Procedure Laterality Date   APPENDECTOMY  1978   CHOLECYSTECTOMY  1998   hand reconstruction Right 10/26/2019   JOINT REPLACEMENT Right 2017   Right index joint replacement with right thumb fusion   NASAL SEPTOPLASTY W/ TURBINOPLASTY   1980   REPLACEMENT TOTAL KNEE Right 03/2003   Rotator cuff surgery     Bilateral bicep tenotomy with rotator cuff surgeries in 2010, 2011   Thumb joint fusion Left 07/2015   Social History:  reports that she has never smoked. She has never used smokeless tobacco. She reports that she does not drink alcohol and does not use drugs.  Allergies  Allergen Reactions   Amlodipine Shortness Of Breath, Swelling and Other (See Comments)    Minor shortness of breath, fatigue, headache, edema, SOB, fatigue, HA   Cefaclor Shortness Of Breath, Itching, Rash and Other (See Comments)    Bronchospasm and wheezing, Tolerated Ancef 05/04/23   Diclofenac Shortness Of Breath, Swelling and Other (See Comments)    Reaction to gel - throat swelling and respiratory distress   Metformin And Related Shortness Of Breath, Rash and Other (See Comments)    Fatigue, scalp rash, shakiness   Penicillin V Potassium Shortness Of Breath and Other (See Comments)    Other Reaction(s): throat tightness, resp. distress   Morphine And Codeine Nausea And Vomiting    Projectile vomiting   Astaxanthin     Other Reaction(s): Unknown   Atorvastatin Other (See Comments)    Severe joint pain and abdominal pain  Other Reaction(s): Myositis, Not available   Boswellia Hives    Other Reaction(s): generalized hives   Cephalosporins Other (See Comments)    Unknown reaction   Clindamycin     Other Reaction(s): Not  available, severe diarrhea   Clindamycin/Lincomycin Diarrhea    Severe diarrhea   Etodolac Other (See Comments)    Stomach and abdominal pain, mouth ulcers  Other Reaction(s): stomach and abdominal pain, mouth ulcers   Gemfibrozil Itching, Nausea And Vomiting and Other (See Comments)    Muscle pain, hot flashes  Other Reaction(s): Not available   Hydrocodone-Acetaminophen Nausea And Vomiting   Metformin     Other Reaction(s): fatigue, SOB, scalp rash, shakiness, Not available   Methotrexate Nausea And Vomiting     Other Reaction(s): fatigue, GI sx, HA, muscle pain, face and scalp rash, mouth ulcers, Not available   Methotrexate Derivatives Other (See Comments)    Fatigue/ GI symptoms/ headache/ muscle pain/ face and scalp rash, mouth ulcers   Methylprednisolone Other (See Comments)    Stomach pain/ tachycardia  Other Reaction(s): Not available   Niacin     Other Reaction(s): flushing, heartburn, insomnia, joint pain, increased HR, Not available   Niacin And Related Other (See Comments)    Flushing, heartburn, insomnia, joint pain, elevated heart rate   Pravastatin Other (See Comments)    Muscle pain  Other Reaction(s): Not available   Rayos [Prednisone] Other (See Comments)    Stomach pain, tachycardia, muscle spasms, parathesia   Simvastatin Diarrhea and Other (See Comments)    Abdominal pain,  Other Reaction(s): abdominal pain and diarrhea, Muscle pain  Other Reaction(s): Not available   Sulfacetamide Sodium-Sulfur     Other Reaction(s): Unknown   Vitamin B12     Other Reaction(s): Unknown   Zoster Vac Recomb Adjuvanted     Other Reaction(s): severe yeast infection   Ampicillin-Sulbactam Sodium Itching, Other (See Comments) and Rash    Blisters on scalp  Other Reaction(s): blisters on scalp, pruritus, Not available, Unknown   Baclofen Rash    Petechial face rash  Other Reaction(s): Not available, petechial face rash, Unknown   Etanercept Other (See Comments) and Rash    Redness, swelling at injection site, tightness in throat w/ mild respiratory distress, muscle spasms, bell's palsy, discoid lupus  Other Reaction(s): Eruption, Cramp, Neuropathy, Eruption, Cramp, Neuropathy, Not available, redness and swelling at sites, tightness in throat, fever/chills, mild resp. distress, Unknown   Ezetimibe Cough, Other (See Comments) and Rash    Headache, itchy rash, fatigue, dry mouth, dry cough  Other Reaction(s): Eruption, Fatigue, Headache, Eruption, Fatigue, Headache, HA, pruritic rash,  dry mouth, dry cough, Not available, Unknown   Golimumab Hives, Itching and Rash    Other Reaction(s): Not available, pruritic rash and hives   Hydroxychloroquine Itching, Other (See Comments) and Rash    Blisters on scalp  Other Reaction(s): blisters on scalp, pruritus, Not available, Unknown   Ibuprofen Rash and Swelling    Swollen face  Other Reaction(s): Not available, rash, swollen face, Unknown   Leflunomide Nausea And Vomiting, Other (See Comments) and Rash    Chest pain, tachycardia, elevated bp  Other Reaction(s): nausea, CP, tachycardia, muscle pain, rash, Not available, Unknown   Lisinopril Cough    Other Reaction(s): Cough, Unknown   Other Itching, Rash and Other (See Comments)    Monsel's Solution =  rash, itching     Penicillins Rash    Did it involve swelling of the face/tongue/throat, SOB, or low BP? YES  Did it involve sudden or severe rash/hives, skin peeling, or any reaction on the inside of your mouth or nose? NO  Did you need to seek medical attention at a hospital or doctor's office? YES  When  did it last happen?   04/2020  If all above answers are "NO", may proceed with cephalosporin use.  Other Reaction(s): Unknown   Sulfa Antibiotics Other (See Comments) and Rash    Unknown reaction   Tocilizumab Other (See Comments) and Rash    Facial rash, rash at injection site, rectal bleeding, headache  Other Reaction(s): facial rash, rash at inj site, rectal bleeding, HA, Not available, Unknown   Wound Dressing Adhesive Other (See Comments) and Rash    Family History  Problem Relation Age of Onset   Heart attack Mother    Diabetes Mother    Cancer Father    Breast cancer Sister    Bone cancer Sister    Prostate cancer Brother    Diabetes Paternal Grandmother    Angioedema Neg Hx    Asthma Neg Hx    Atopy Neg Hx    Eczema Neg Hx    Immunodeficiency Neg Hx    Urticaria Neg Hx    Allergic rhinitis Neg Hx     Prior to Admission medications    Medication Sig Start Date End Date Taking? Authorizing Provider  acetaminophen (TYLENOL) 500 MG tablet Take 1,000 mg by mouth daily as needed (pain).    [provider]  acetaminophen (TYLENOL) 650 MG CR tablet Take 1,300 mg by mouth at bedtime.    [provider]  albuterol (VENTOLIN HFA) 108 (90 Base) MCG/ACT inhaler Inhale 2 puffs into the lungs every 4-6 hours as needed for cough, wheeze, shortness of breath or chest tightness. 03/10/20   Marcelyn Bruins, MD  allopurinol (ZYLOPRIM) 100 MG tablet Take 100 mg by mouth daily with supper.    [provider]  allopurinol (ZYLOPRIM) 100 MG tablet Take 2 tablets by mouth daily. 08/21/20   [provider]  calcium-vitamin D (OSCAL WITH D) 500-200 MG-UNIT TABS tablet Take 1 tablet by mouth 2 (two) times daily. 04/30/06   [provider]  carboxymethylcellulose (REFRESH PLUS) 0.5 % SOLN INSTILL 1 DROP IN [facility] FOUR TIMES A DAY 07/26/20   [provider]  Cholecalciferol (VITAMIN D-3) 125 MCG (5000 UT) TABS Take 5,000 Units by mouth at bedtime.    [provider]  Cholecalciferol 25 MCG (1000 UT) tablet Take 2 tablets by mouth daily. 03/14/16   [provider]  colchicine 0.6 MG tablet Take 0.6 mg by mouth daily as needed (for flare up).     [provider]  Cranberry 500 MG CAPS Take 500 mg by mouth daily after breakfast.    [provider]  cyclobenzaprine (FLEXERIL) 10 MG tablet Take 10 mg by mouth See admin instructions. Take one tablet (10 mg) by mouth daily at bedtime, may also take one tablet (10 mg) daily as needed for pain    [provider]  cyclobenzaprine (FLEXERIL) 5 MG tablet Take 1 tablet by mouth daily as needed. 01/18/20   [provider]  diphenhydrAMINE (BENADRYL) 12.5 MG/5ML liquid Take 12.5 mg by mouth daily as needed for itching or allergies.    [provider]  levothyroxine (SYNTHROID) 112 MCG tablet Take 112  mcg by mouth daily before breakfast.    [provider]  levothyroxine (SYNTHROID) 112 MCG tablet TAKE ONE TABLET BY MOUTH EVERY DAY FOR THYROID 04/25/21   [provider]  losartan (COZAAR) 25 MG tablet losartan 25 mg tablet  Take 1 tablet every day by oral route.    [provider]  losartan (COZAAR) 25 MG tablet  TAKE ONE TABLET BY MOUTH ONCE EVERY DAY FOR BLOOD PRESSURE 06/01/21   [provider]  Omega-3 Fatty Acids (FISH OIL) 1000 MG CAPS Take 1 capsule by mouth at bedtime. 03/23/07   [provider]  Probiotic Product (PROBIOTIC PO) Take 1 tablet by mouth 3 (three) times a week.    [provider]  RABEprazole (ACIPHEX) 20 MG tablet TAKE ONE TABLET BY MOUTH IN THE MORNING AS NEEDED 03/23/07   [provider]  traMADol (ULTRAM) 50 MG tablet Take 1 tablet (50 mg total) by mouth every 8 (eight) hours as needed for moderate pain or severe pain (pain). 05/04/23   Beverely Low, MD  TURMERIC PO Take 1,500 mg by mouth daily.    [provider]    Physical Exam: Vitals:   05/04/23 2000 05/04/23 2005 05/04/23 2010 05/04/23 2015  BP: (!) 152/58   (!) 156/66  Pulse: (!) 102 (!) 104 (!) 109 (!) 114  Resp: 17 17 (!) 25 18  Temp:    98.2 F (36.8 C)  TempSrc:      SpO2: 96%   97%  Weight:      Height:       Constitutional: NAD, calm, comfortable Eyes: PERRL, lids and conjunctivae normal ENMT: Mucous membranes are moist. Posterior pharynx clear of any exudate or lesions.Normal dentition.  Neck: normal, supple, no masses, no thyromegaly Respiratory: clear to auscultation bilaterally, no wheezing, no crackles. Normal respiratory effort. No accessory muscle use.  Cardiovascular: Sinus tachycardia, no murmurs / rubs / gallops. No extremity edema. 2+ pedal pulses. No carotid bruits.  Abdomen: no tenderness, no masses palpated. No hepatosplenomegaly. Bowel sounds positive.  Musculoskeletal: Right shoulder immobilized postoperatively.   Skin: no rashes, lesions, ulcers. No induration Neurologic: CN 2-12 grossly intact. Sensation intact, DTR normal. Strength 5/5 in all 4.  Psychiatric: Normal judgment and insight. Alert and oriented x 3. Normal mood  Data Reviewed:  Blood pressure 180/75 pulse 116, white count 16.4 hemoglobin 11.7 CT of the cervical spine CT head and maxillofacial are all within normal.  X-ray of the elbow right hand (all done with no acute findings.  X-ray of the right shoulder showed comminuted displaced fracture of the head and neck of the proximal right humerus.  Assessment and Plan:  #1 right humeral neck fracture: Patient will be admitted currently postoperatively.  Pain management.  PT OT.  Orthopedics to continue with care.  DVT prophylaxis.  #2 hypothyroidism: Continue levothyroxine.  #3 GERD: Continue with PPIs  #4 history of rheumatoid arthritis, continue home regimen  #5 multiple drug allergies: Patient has complex multiple drug allergies.  We will take that into consideration as we treat patient.  #6 paroxysmal atrial fibrillation: Continue close monitoring at home treatment  #7 essential hypertension: Blood pressure appears controlled.  Continue home regimen    Advance Care Planning:   Code Status: Prior   Consults: Dr. Ranell Patrick, orthopedic surgery  Family Communication: No family at bedside  Severity of Illness: The appropriate patient status for this patient is INPATIENT. Inpatient status is judged to be reasonable and necessary in order to provide the required intensity of service to ensure the patient's safety. The patient's presenting symptoms, physical exam findings, and initial radiographic and laboratory data in the context of their chronic comorbidities is felt to place them at high risk for further clinical deterioration. Furthermore, it is not anticipated that the patient will be medically stable for discharge from the hospital within 2 midnights of admission.   *  I certify  that at the point of admission it is my clinical judgment that the patient will require inpatient hospital care spanning beyond 2 midnights from the point of admission due to high intensity of service, high risk for further deterioration and high frequency of surveillance required.*  AuthorLonia Blood, MD 05/04/2023 8:31 PM  For on call review [email or web address].

## 2023-05-04 NOTE — Anesthesia Preprocedure Evaluation (Addendum)
Anesthesia Evaluation  Patient identified by MRN, date of birth, ID band Patient awake    Reviewed: Allergy & Precautions, NPO status , Patient's Chart, lab work & pertinent test results  Airway Mallampati: IV  TM Distance: <3 FB Neck ROM: Full    Dental  (+) Teeth Intact, Dental Advisory Given   Pulmonary sleep apnea    breath sounds clear to auscultation       Cardiovascular hypertension, Pt. on medications  Rhythm:Regular Rate:Normal     Neuro/Psych  Headaches TIA negative psych ROS   GI/Hepatic Neg liver ROS,GERD  ,,  Endo/Other  diabetesHypothyroidism    Renal/GU negative Renal ROS     Musculoskeletal  (+) Arthritis ,    Abdominal   Peds  Hematology   Anesthesia Other Findings   Reproductive/Obstetrics                             Anesthesia Physical Anesthesia Plan  ASA: 3 and emergent  Anesthesia Plan: General   Post-op Pain Management: Regional block*   Induction: Intravenous  PONV Risk Score and Plan: 4 or greater and Ondansetron, Dexamethasone, Midazolam and Treatment may vary due to age or medical condition  Airway Management Planned: Oral ETT  Additional Equipment: None  Intra-op Plan:   Post-operative Plan: Extubation in OR  Informed Consent: I have reviewed the patients History and Physical, chart, labs and discussed the procedure including the risks, benefits and alternatives for the proposed anesthesia with the patient or authorized representative who has indicated his/her understanding and acceptance.     Dental advisory given  Plan Discussed with: CRNA  Anesthesia Plan Comments:        Anesthesia Quick Evaluation

## 2023-05-04 NOTE — H&P (Signed)
Patient's anticipated LOS is less than 2 midnights, meeting these requirements: - Younger than 83 - Lives within 1 hour of care - Has a competent adult at home to recover with post-op recover - NO history of  - Chronic pain requiring opiods  - Diabetes  - Coronary Artery Disease  - Heart failure  - Heart attack  - Stroke  - DVT/VTE  - Cardiac arrhythmia  - Respiratory Failure/COPD  - Renal failure  - Anemia  - Advanced Liver disease     Kristy Gill is an 72 y.o. female.    Chief Complaint: right shoulder pain  HPI: Pt is a 72 y.o. female complaining of right shoulder pain after recent fall. Pain had continually increased since the beginning. X-rays in the clinic show proximal humerus fracture with displacement. Pt has tried various conservative treatments which have failed to alleviate their symptoms. Various options are discussed with the patient. Risks, benefits and expectations were discussed with the patient. Patient understand the risks, benefits and expectations and wishes to proceed with surgery.   PCP:  Aliene Beams, MD  D/C Plans: Home  PMH: Past Medical History:  Diagnosis Date   Arthritis    Breast disorder    Clawfoot, acquired    Diabetes mellitus without complication (HCC)    Gall stones    Gout    Hypercalcemia    Hyperlipidemia    Hypertension    Hypertensive heart disease without heart failure    Obesity    Osteoporosis    Pancreatitis    Primary osteoarthritis    Rotator cuff rupture    Thyroid disease    hypothryoidism    PSH: Past Surgical History:  Procedure Laterality Date   APPENDECTOMY  1978   CHOLECYSTECTOMY  1998   hand reconstruction Right 10/26/2019   JOINT REPLACEMENT Right 2017   Right index joint replacement with right thumb fusion   NASAL SEPTOPLASTY W/ TURBINOPLASTY  1980   REPLACEMENT TOTAL KNEE Right 03/2003   Rotator cuff surgery     Bilateral bicep tenotomy with rotator cuff surgeries in 2010, 2011   Thumb joint  fusion Left 07/2015    Social History:  reports that she has never smoked. She has never used smokeless tobacco. She reports that she does not drink alcohol and does not use drugs. BMI: Estimated body mass index is 33.63 kg/m as calculated from the following:   Height as of this encounter: 5\' 1"  (1.549 m).   Weight as of this encounter: 80.7 kg.  Lab Results  Component Value Date   ALBUMIN 4.5 03/10/2020   Diabetes:   Patient has a diagnosis of diabetes,  Lab Results  Component Value Date   HGBA1C 6.7 (H) 10/31/2019   Smoking Status:   reports that she has never smoked. She has never used smokeless tobacco.    Allergies:  Allergies  Allergen Reactions   Ceclor [Cefaclor] Shortness Of Breath, Itching and Rash   Metformin And Related Shortness Of Breath, Rash and Other (See Comments)    Fatigue, scalp rash, shakiness   Voltaren [Diclofenac] Shortness Of Breath and Swelling    Reaction to gel - throat swelling   Morphine And Codeine Nausea And Vomiting    Projectile vomiting   Amlodipine Swelling and Other (See Comments)    Minor shortness of breath, fatigue, headache,    Arava [Leflunomide] Nausea And Vomiting and Other (See Comments)    Chest pain, tachycardia, elevated bp   Atorvastatin Other (See Comments)  Severe joint pain and abdominal pain   Boswellia Hives   Cephalosporins Other (See Comments)    Unknown reaction   Clindamycin/Lincomycin Diarrhea    Severe diarrhea   Gemfibrozil Itching, Nausea And Vomiting and Other (See Comments)    Muscle pain, hot flashes   Lisinopril Cough   Lodine [Etodolac] Other (See Comments)    Stomach and abdominal pain, mouth ulcers   Medrol [Methylprednisolone] Other (See Comments)    Stomach pain/ tachycardia   Methotrexate Derivatives Other (See Comments)    Fatigue/ GI symptoms/ headache/ muscle pain/ face and scalp rash, mouth ulcers   Niacin And Related Other (See Comments)    Flushing, heartburn, insomnia, joint pain,  elevated heart rate   Plaquenil [Hydroxychloroquine] Itching and Other (See Comments)    Blisters on scalp    Pravastatin Other (See Comments)    Muscle pain   Rayos [Prednisone] Other (See Comments)    Stomach pain, tachycardia, muscle spasms, parathesia   Sulfa Antibiotics Other (See Comments)    Unknown reaction   Unasyn [Ampicillin-Sulbactam Sodium] Itching and Other (See Comments)    Blisters on scalp    Zocor [Simvastatin] Diarrhea and Other (See Comments)    Abdominal pain,    Actemra [Tocilizumab] Rash and Other (See Comments)    Facial rash, rash at injection site, rectal bleeding, headache   Baclofen Rash    Petechial face rash   Enbrel [Etanercept] Rash and Other (See Comments)    Redness, swelling at injection site, tightness in throat w/ mild respiratory distress, muscle spasms, bell's palsy, discoid lupus   Ibuprofen Swelling and Rash    Swollen face   Other Itching, Rash and Other (See Comments)    monsel solution caused rash, itching   Penicillins Rash    Did it involve swelling of the face/tongue/throat, SOB, or low BP? YES Did it involve sudden or severe rash/hives, skin peeling, or any reaction on the inside of your mouth or nose? NO Did you need to seek medical attention at a hospital or doctor's office? YES When did it last happen?   04/2020 If all above answers are "NO", may proceed with cephalosporin use.   Simponi [Golimumab] Hives, Itching and Rash   Zetia [Ezetimibe] Rash, Other (See Comments) and Cough    Headache, itchy rash, fatigue, dry mouth, dry cough    Medications: Current Facility-Administered Medications  Medication Dose Route Frequency Provider Last Rate Last Admin   ceFAZolin (ANCEF) 2-4 GM/100ML-% IVPB            phenylephrine (NEOSYNEPHRINE) 20-0.9 MG/250ML-% infusion            tranexamic acid (CYKLOKAPRON) 1000MG /135mL IVPB            tranexamic acid (CYKLOKAPRON) IVPB 1,000 mg  1,000 mg Intravenous To OR Beverely Low, MD       Mitzi Hansen  Hold] vancomycin (VANCOCIN) IVPB 1000 mg/200 mL premix  1,000 mg Intravenous 60 min Pre-Op Beverely Low, MD       Facility-Administered Medications Ordered in Other Encounters  Medication Dose Route Frequency Provider Last Rate Last Admin   fentaNYL (SUBLIMAZE) injection   Intravenous Anesthesia Intra-op Williford, Peggy D, CRNA   50 mcg at 05/04/23 1710   midazolam (VERSED) 5 MG/5ML injection   Intravenous Anesthesia Intra-op Williford, Peggy D, CRNA   1 mg at 05/04/23 1710    No results found for this or any previous visit (from the past 48 hour(s)). CT Maxillofacial Wo Contrast  Result Date: 05/04/2023  CLINICAL DATA:  Trauma, fall EXAM: CT MAXILLOFACIAL WITHOUT CONTRAST TECHNIQUE: Multidetector CT imaging of the maxillofacial structures was performed. Multiplanar CT image reconstructions were also generated. RADIATION DOSE REDUCTION: This exam was performed according to the departmental dose-optimization program which includes automated exposure control, adjustment of the mA and/or kV according to patient size and/or use of iterative reconstruction technique. COMPARISON:  None Available. FINDINGS: Osseous: No recent displaced fractures are seen. There is slight motion artifact limiting evaluation of right side of mandible. Orbits: Optic globes are symmetrical. Retrobulbar soft tissues are unremarkable. Sinuses: Unremarkable. Soft tissues: Unremarkable. Limited intracranial: Unremarkable. IMPRESSION: No recent fracture is seen. There are no air-fluid levels in paranasal sinuses. No focal abnormalities are seen in orbits. Electronically Signed   By: Ernie Avena M.D.   On: 05/04/2023 14:33   CT Cervical Spine Wo Contrast  Result Date: 05/04/2023 CLINICAL DATA:  Trauma, fall EXAM: CT CERVICAL SPINE WITHOUT CONTRAST TECHNIQUE: Multidetector CT imaging of the cervical spine was performed without intravenous contrast. Multiplanar CT image reconstructions were also generated. RADIATION DOSE  REDUCTION: This exam was performed according to the departmental dose-optimization program which includes automated exposure control, adjustment of the mA and/or kV according to patient size and/or use of iterative reconstruction technique. COMPARISON:  MR cervical spine done on 10/31/2019 FINDINGS: Alignment: Alignment of posterior margins of vertebral bodies appears normal. Skull base and vertebrae: No recent fracture is seen. Degenerative changes are noted in cervical spine, most severe from C4-C7 levels. Soft tissues and spinal canal: There is extrinsic pressure over the ventral margin of thecal sac caused by posterior bony spurs from C4-C7 levels, most severe at C5-C6 and C6-C7 levels with spinal stenosis. Disc levels: There is mild encroachment of neural foramina at C3-C4 level. Moderate to marked encroachment of neural foramina is seen at C5-C6 and C6-C7 levels. Upper chest: Visualized apical portions of both lungs are unremarkable. Other: Thyroid is smaller than usual in size. IMPRESSION: No recent fracture is seen. Cervical spondylosis with spinal stenosis and encroachment of neural foramina at multiple levels as described in the body of the report. Electronically Signed   By: Ernie Avena M.D.   On: 05/04/2023 14:24   CT Head Wo Contrast  Result Date: 05/04/2023 CLINICAL DATA:  Trauma, fall EXAM: CT HEAD WITHOUT CONTRAST TECHNIQUE: Contiguous axial images were obtained from the base of the skull through the vertex without intravenous contrast. RADIATION DOSE REDUCTION: This exam was performed according to the departmental dose-optimization program which includes automated exposure control, adjustment of the mA and/or kV according to patient size and/or use of iterative reconstruction technique. COMPARISON:  10/30/2019 FINDINGS: Brain: No acute intracranial findings are seen. There are no signs of bleeding within the cranium. Ventricles are nondilated. Cortical sulci are prominent. Vascular:  Scattered arterial calcifications are seen. Skull: Unremarkable. Sinuses/Orbits: Unremarkable. Other: None. IMPRESSION: No acute intracranial findings are seen in noncontrast CT brain. Atrophy. Electronically Signed   By: Ernie Avena M.D.   On: 05/04/2023 14:19   DG Knee Complete 4 Views Left  Result Date: 05/04/2023 CLINICAL DATA:  Trauma, fall EXAM: LEFT KNEE - COMPLETE 4+ VIEW COMPARISON:  None Available. FINDINGS: No fracture or dislocation is seen. There is no effusion in suprapatellar bursa. Tiny bony spurs are seen in the medial compartment. IMPRESSION: No fracture or dislocation is seen in left knee. Electronically Signed   By: Ernie Avena M.D.   On: 05/04/2023 13:23   DG Wrist Complete Right  Result Date: 05/04/2023 CLINICAL DATA:  Trauma, fall EXAM: RIGHT WRIST - COMPLETE 3+ VIEW COMPARISON:  None Available. FINDINGS: Chondrocalcinosis is seen. No recent displaced fracture or dislocation is seen. Small smoothly marginated calcifications adjacent to scaphoid may be residual from previous injury. Degenerative changes are noted in first carpometacarpal joint. Bony spurs are seen in interphalangeal joint of the thumb. IMPRESSION: No recent fracture or dislocation is seen. Degenerative changes are noted in multiple joints. Electronically Signed   By: Ernie Avena M.D.   On: 05/04/2023 13:16   DG Hand Complete Right  Result Date: 05/04/2023 CLINICAL DATA:  Trauma, fall EXAM: RIGHT HAND - COMPLETE 3+ VIEW COMPARISON:  None Available. FINDINGS: No recent fracture or dislocation is seen in right hand. There is previous arthroplasty in second metacarpophalangeal joint. There is surgical fusion in first metacarpophalangeal joint. Degenerative changes are noted in first carpometacarpal joint and multiple interphalangeal joints. There are faint calcifications in costal cartilage is seen right wrist. There are small smoothly marginated calcifications adjacent to scaphoid. These  findings may be residual from previous injury and degenerative arthritis. IMPRESSION: No recent fracture or dislocation is seen in right hand. Other findings as described above. Electronically Signed   By: Ernie Avena M.D.   On: 05/04/2023 13:14   DG Shoulder Right  Result Date: 05/04/2023 CLINICAL DATA:  Trauma, fall EXAM: RIGHT SHOULDER - 2+ VIEW COMPARISON:  None Available. FINDINGS: Recent comminuted fracture is seen in the head and neck of proximal right humerus. There is medial and anterior displacement of distal major fracture fragment. There are metallic densities in proximal humerus suggesting previous surgical intervention. There is no definite dislocation. IMPRESSION: Comminuted displaced fracture is seen in the head and neck of the proximal right humerus. Electronically Signed   By: Ernie Avena M.D.   On: 05/04/2023 13:12   DG Elbow Complete Right  Result Date: 05/04/2023 CLINICAL DATA:  Trauma, fall EXAM: RIGHT ELBOW - COMPLETE 3+ VIEW COMPARISON:  None Available. FINDINGS: Study is limited due to less than optimal positioning. As far as seen, no recent fracture or dislocation is seen. IMPRESSION: No displaced fracture or dislocation is seen limited views of the right elbow. Electronically Signed   By: Ernie Avena M.D.   On: 05/04/2023 13:10    ROS: Pain with rom of the right upper extremity  Physical Exam: Alert and oriented 72 y.o. female in no acute distress Cranial nerves 2-12 intact Cervical spine: full rom with no tenderness, nv intact distally Chest: active breath sounds bilaterally, no wheeze rhonchi or rales Heart: regular rate and rhythm, no murmur Abd: non tender non distended with active bowel sounds Hip is stable with rom  Right shoulder painful rom with swelling Nv intact distally No signs of open injury  Assessment/Plan Assessment: right proximal humerus fracture with displacement  Plan:  Patient will undergo a right reverse total  shoulder by Dr. Ranell Patrick at [facility] Risks benefits and expectations were discussed with the patient. Patient understand risks, benefits and expectations and wishes to proceed. Preoperative templating of the joint replacement has been completed, documented, and submitted to the Operating Room personnel in order to optimize intra-operative equipment management.   Alphonsa Overall PA-C, MPAS [facility] is now [employer] [address], [place], [place] [address] Phone: [phone] [email or web address] Facebook  [employer]

## 2023-05-04 NOTE — Discharge Instructions (Signed)
Ice to the shoulder constantly.  Keep the incision covered and clean and dry for one week, then ok to get it wet in the shower.  Do GENTLE exercise as instructed several times per day.   DO NOT reach behind your back or push up out of a chair with the operative arm.  Use a sling while you are up and around for comfort, may remove while seated.  Keep pillow propped behind the operative elbow. You should always be able to see your elbow with it propped properly  Follow up with Dr Ranell Patrick in two weeks in the office, call [phone] for appt  PLEASE CALL DR Phoenix Dresser (CELL) [phone] with any questions or concerns

## 2023-05-04 NOTE — ED Triage Notes (Signed)
PT[place]. Pt c/o right shoulder pain and bridge of her nose pain. Pt unable to tolerate a C-collar for PTAR.

## 2023-05-04 NOTE — Transfer of Care (Signed)
Immediate Anesthesia Transfer of Care Note  Patient: Kristy Gill  Procedure(s) Performed: REVERSE SHOULDER ARTHROPLASTY (Right: Shoulder)  Patient Location: PACU  Anesthesia Type:GA combined with regional for post-op pain  Level of Consciousness: drowsy and patient cooperative  Airway & Oxygen Therapy: Patient Spontanous Breathing and Patient connected to face mask oxygen  Post-op Assessment: Report given to RN and Post -op Vital signs reviewed and stable  Post vital signs: Reviewed and stable  Last Vitals:  Vitals Value Taken Time  BP 150/69 05/04/23 1955  Temp 37.6 C 05/04/23 1952  Pulse 104 05/04/23 1957  Resp 23 05/04/23 1957  SpO2 95 % 05/04/23 1957  Vitals shown include unfiled device data.  Last Pain:  Vitals:   05/04/23 1410  TempSrc:   PainSc: 9       Patients Stated Pain Goal: 0 (05/04/23 1153)  Complications: No notable events documented.

## 2023-05-04 NOTE — Progress Notes (Signed)
PHARMACIST - PHYSICIAN ORDER COMMUNICATION  CONCERNING: P&T Medication Policy on Herbal Medications  DESCRIPTION:  This patient's order for:  turmeric and cranberry  has been noted.  This product(s) is classified as an "herbal" or natural product. Due to a lack of definitive safety studies or FDA approval, nonstandard manufacturing practices, plus the potential risk of unknown drug-drug interactions while on inpatient medications, the Pharmacy and Therapeutics Committee does not permit the use of "herbal" or natural products of this type within [facility].   ACTION TAKEN: The pharmacy department is unable to verify this order at this time and your patient has been informed of this safety policy. Please reevaluate patient's clinical condition at discharge and address if the herbal or natural product(s) should be resumed at that time.   Arley Phenix RPh 05/04/2023, 10:01 PM

## 2023-05-04 NOTE — Anesthesia Procedure Notes (Signed)
Procedure Name: Intubation Date/Time: 05/04/2023 5:35 PM  Performed by: Savon Cobbs D, CRNAPre-anesthesia Checklist: Patient identified, Emergency Drugs available, Suction available and Patient being monitored Patient Re-evaluated:Patient Re-evaluated prior to induction Oxygen Delivery Method: Circle system utilized Preoxygenation: Pre-oxygenation with 100% oxygen Induction Type: IV induction Ventilation: Mask ventilation without difficulty Laryngoscope Size: Mac and 4 Grade View: Grade I Tube type: Oral Tube size: 7.0 mm Number of attempts: 1 Airway Equipment and Method: Stylet and Oral airway Placement Confirmation: ETT inserted through vocal cords under direct vision, positive ETCO2 and breath sounds checked- equal and bilateral Secured at: 21 cm Tube secured with: Tape Dental Injury: Teeth and Oropharynx as per pre-operative assessment

## 2023-05-04 NOTE — Consult Note (Signed)
Reason for Consult:Right shoulder pain Referring Physician: EDP  Kristy Gill is an 72 y.o. female.  HPI: 72 yo female who presents after a ground level fall(trip) at church today. Patient landed on her shoulder and complained of immediate pain and crunching sensation. She was unable to get up and was transported to the ED for eval and treatment. She denies any hip or knee pain or back pain. She does have some facial pain.  Past Medical History:  Diagnosis Date   Arthritis    Breast disorder    Clawfoot, acquired    Diabetes mellitus without complication (HCC)    Gall stones    Gout    Hypercalcemia    Hyperlipidemia    Hypertension    Hypertensive heart disease without heart failure    Obesity    Osteoporosis    Pancreatitis    Primary osteoarthritis    Rotator cuff rupture    Thyroid disease    hypothryoidism    Past Surgical History:  Procedure Laterality Date   APPENDECTOMY  1978   CHOLECYSTECTOMY  1998   hand reconstruction Right 10/26/2019   JOINT REPLACEMENT Right 2017   Right index joint replacement with right thumb fusion   NASAL SEPTOPLASTY W/ TURBINOPLASTY  1980   REPLACEMENT TOTAL KNEE Right 03/2003   Rotator cuff surgery     Bilateral bicep tenotomy with rotator cuff surgeries in 2010, 2011   Thumb joint fusion Left 07/2015    Family History  Problem Relation Age of Onset   Heart attack Mother    Diabetes Mother    Cancer Father    Breast cancer Sister    Bone cancer Sister    Prostate cancer Brother    Diabetes Paternal Grandmother    Angioedema Neg Hx    Asthma Neg Hx    Atopy Neg Hx    Eczema Neg Hx    Immunodeficiency Neg Hx    Urticaria Neg Hx    Allergic rhinitis Neg Hx     Social History:  reports that she has never smoked. She has never used smokeless tobacco. She reports that she does not drink alcohol and does not use drugs.  Allergies:  Allergies  Allergen Reactions   Ceclor [Cefaclor] Shortness Of Breath, Itching and Rash    Metformin And Related Shortness Of Breath, Rash and Other (See Comments)    Fatigue, scalp rash, shakiness   Voltaren [Diclofenac] Shortness Of Breath and Swelling    Reaction to gel - throat swelling   Morphine And Codeine Nausea And Vomiting    Projectile vomiting   Amlodipine Swelling and Other (See Comments)    Minor shortness of breath, fatigue, headache,    Arava [Leflunomide] Nausea And Vomiting and Other (See Comments)    Chest pain, tachycardia, elevated bp   Atorvastatin Other (See Comments)    Severe joint pain and abdominal pain   Boswellia Hives   Cephalosporins Other (See Comments)    Unknown reaction   Clindamycin/Lincomycin Diarrhea    Severe diarrhea   Gemfibrozil Itching, Nausea And Vomiting and Other (See Comments)    Muscle pain, hot flashes   Lisinopril Cough   Lodine [Etodolac] Other (See Comments)    Stomach and abdominal pain, mouth ulcers   Medrol [Methylprednisolone] Other (See Comments)    Stomach pain/ tachycardia   Methotrexate Derivatives Other (See Comments)    Fatigue/ GI symptoms/ headache/ muscle pain/ face and scalp rash, mouth ulcers   Niacin And Related Other (See Comments)  Flushing, heartburn, insomnia, joint pain, elevated heart rate   Plaquenil [Hydroxychloroquine] Itching and Other (See Comments)    Blisters on scalp    Pravastatin Other (See Comments)    Muscle pain   Rayos [Prednisone] Other (See Comments)    Stomach pain, tachycardia, muscle spasms, parathesia   Sulfa Antibiotics Other (See Comments)    Unknown reaction   Unasyn [Ampicillin-Sulbactam Sodium] Itching and Other (See Comments)    Blisters on scalp    Zocor [Simvastatin] Diarrhea and Other (See Comments)    Abdominal pain,    Actemra [Tocilizumab] Rash and Other (See Comments)    Facial rash, rash at injection site, rectal bleeding, headache   Baclofen Rash    Petechial face rash   Enbrel [Etanercept] Rash and Other (See Comments)    Redness, swelling at  injection site, tightness in throat w/ mild respiratory distress, muscle spasms, bell's palsy, discoid lupus   Ibuprofen Swelling and Rash    Swollen face   Other Itching, Rash and Other (See Comments)    monsel solution caused rash, itching   Penicillins Rash    Did it involve swelling of the face/tongue/throat, SOB, or low BP? YES Did it involve sudden or severe rash/hives, skin peeling, or any reaction on the inside of your mouth or nose? NO Did you need to seek medical attention at a hospital or doctor's office? YES When did it last happen?   04/2020 If all above answers are "NO", may proceed with cephalosporin use.   Simponi [Golimumab] Hives, Itching and Rash   Zetia [Ezetimibe] Rash, Other (See Comments) and Cough    Headache, itchy rash, fatigue, dry mouth, dry cough    Medications: I have reviewed the patient's current medications.  No results found for this or any previous visit (from the past 48 hour(s)).  CT Maxillofacial Wo Contrast  Result Date: 05/04/2023 CLINICAL DATA:  Trauma, fall EXAM: CT MAXILLOFACIAL WITHOUT CONTRAST TECHNIQUE: Multidetector CT imaging of the maxillofacial structures was performed. Multiplanar CT image reconstructions were also generated. RADIATION DOSE REDUCTION: This exam was performed according to the departmental dose-optimization program which includes automated exposure control, adjustment of the mA and/or kV according to patient size and/or use of iterative reconstruction technique. COMPARISON:  None Available. FINDINGS: Osseous: No recent displaced fractures are seen. There is slight motion artifact limiting evaluation of right side of mandible. Orbits: Optic globes are symmetrical. Retrobulbar soft tissues are unremarkable. Sinuses: Unremarkable. Soft tissues: Unremarkable. Limited intracranial: Unremarkable. IMPRESSION: No recent fracture is seen. There are no air-fluid levels in paranasal sinuses. No focal abnormalities are seen in orbits.  Electronically Signed   By: Ernie Avena M.D.   On: 05/04/2023 14:33   CT Cervical Spine Wo Contrast  Result Date: 05/04/2023 CLINICAL DATA:  Trauma, fall EXAM: CT CERVICAL SPINE WITHOUT CONTRAST TECHNIQUE: Multidetector CT imaging of the cervical spine was performed without intravenous contrast. Multiplanar CT image reconstructions were also generated. RADIATION DOSE REDUCTION: This exam was performed according to the departmental dose-optimization program which includes automated exposure control, adjustment of the mA and/or kV according to patient size and/or use of iterative reconstruction technique. COMPARISON:  MR cervical spine done on 10/31/2019 FINDINGS: Alignment: Alignment of posterior margins of vertebral bodies appears normal. Skull base and vertebrae: No recent fracture is seen. Degenerative changes are noted in cervical spine, most severe from C4-C7 levels. Soft tissues and spinal canal: There is extrinsic pressure over the ventral margin of thecal sac caused by posterior bony spurs from C4-C7  levels, most severe at C5-C6 and C6-C7 levels with spinal stenosis. Disc levels: There is mild encroachment of neural foramina at C3-C4 level. Moderate to marked encroachment of neural foramina is seen at C5-C6 and C6-C7 levels. Upper chest: Visualized apical portions of both lungs are unremarkable. Other: Thyroid is smaller than usual in size. IMPRESSION: No recent fracture is seen. Cervical spondylosis with spinal stenosis and encroachment of neural foramina at multiple levels as described in the body of the report. Electronically Signed   By: Ernie Avena M.D.   On: 05/04/2023 14:24   CT Head Wo Contrast  Result Date: 05/04/2023 CLINICAL DATA:  Trauma, fall EXAM: CT HEAD WITHOUT CONTRAST TECHNIQUE: Contiguous axial images were obtained from the base of the skull through the vertex without intravenous contrast. RADIATION DOSE REDUCTION: This exam was performed according to the  departmental dose-optimization program which includes automated exposure control, adjustment of the mA and/or kV according to patient size and/or use of iterative reconstruction technique. COMPARISON:  10/30/2019 FINDINGS: Brain: No acute intracranial findings are seen. There are no signs of bleeding within the cranium. Ventricles are nondilated. Cortical sulci are prominent. Vascular: Scattered arterial calcifications are seen. Skull: Unremarkable. Sinuses/Orbits: Unremarkable. Other: None. IMPRESSION: No acute intracranial findings are seen in noncontrast CT brain. Atrophy. Electronically Signed   By: Ernie Avena M.D.   On: 05/04/2023 14:19   DG Knee Complete 4 Views Left  Result Date: 05/04/2023 CLINICAL DATA:  Trauma, fall EXAM: LEFT KNEE - COMPLETE 4+ VIEW COMPARISON:  None Available. FINDINGS: No fracture or dislocation is seen. There is no effusion in suprapatellar bursa. Tiny bony spurs are seen in the medial compartment. IMPRESSION: No fracture or dislocation is seen in left knee. Electronically Signed   By: Ernie Avena M.D.   On: 05/04/2023 13:23   DG Wrist Complete Right  Result Date: 05/04/2023 CLINICAL DATA:  Trauma, fall EXAM: RIGHT WRIST - COMPLETE 3+ VIEW COMPARISON:  None Available. FINDINGS: Chondrocalcinosis is seen. No recent displaced fracture or dislocation is seen. Small smoothly marginated calcifications adjacent to scaphoid may be residual from previous injury. Degenerative changes are noted in first carpometacarpal joint. Bony spurs are seen in interphalangeal joint of the thumb. IMPRESSION: No recent fracture or dislocation is seen. Degenerative changes are noted in multiple joints. Electronically Signed   By: Ernie Avena M.D.   On: 05/04/2023 13:16   DG Hand Complete Right  Result Date: 05/04/2023 CLINICAL DATA:  Trauma, fall EXAM: RIGHT HAND - COMPLETE 3+ VIEW COMPARISON:  None Available. FINDINGS: No recent fracture or dislocation is seen in right  hand. There is previous arthroplasty in second metacarpophalangeal joint. There is surgical fusion in first metacarpophalangeal joint. Degenerative changes are noted in first carpometacarpal joint and multiple interphalangeal joints. There are faint calcifications in costal cartilage is seen right wrist. There are small smoothly marginated calcifications adjacent to scaphoid. These findings may be residual from previous injury and degenerative arthritis. IMPRESSION: No recent fracture or dislocation is seen in right hand. Other findings as described above. Electronically Signed   By: Ernie Avena M.D.   On: 05/04/2023 13:14   DG Shoulder Right  Result Date: 05/04/2023 CLINICAL DATA:  Trauma, fall EXAM: RIGHT SHOULDER - 2+ VIEW COMPARISON:  None Available. FINDINGS: Recent comminuted fracture is seen in the head and neck of proximal right humerus. There is medial and anterior displacement of distal major fracture fragment. There are metallic densities in proximal humerus suggesting previous surgical intervention. There is no definite dislocation.  IMPRESSION: Comminuted displaced fracture is seen in the head and neck of the proximal right humerus. Electronically Signed   By: Ernie Avena M.D.   On: 05/04/2023 13:12   DG Elbow Complete Right  Result Date: 05/04/2023 CLINICAL DATA:  Trauma, fall EXAM: RIGHT ELBOW - COMPLETE 3+ VIEW COMPARISON:  None Available. FINDINGS: Study is limited due to less than optimal positioning. As far as seen, no recent fracture or dislocation is seen. IMPRESSION: No displaced fracture or dislocation is seen limited views of the right elbow. Electronically Signed   By: Ernie Avena M.D.   On: 05/04/2023 13:10    Review of Systems Blood pressure (!) 181/75, pulse 96, temperature 97.9 F (36.6 C), temperature source Oral, resp. rate 18, SpO2 99%. Physical Exam Patient with several facial abrasions but minimal swelling. Gaze is conjugate. Pain free AROM of the  cervical spine. Non tender CTL spine. Left UE with pain free AROM. Normal strength. Right shoulder swollen and very tender. Non tender over the wrist and hand. She is able to wiggle the fingers.  Chest non tender and pelvis stable  Assessment/Plan: Right displaced proximal humerus fracture.  Patient has 100% shaft displacement anteriorly and medially. Greater tuberosity is also off.  Based on the displacement of the fracture and her previous RCR and baseline pain in the shoulder, I recommend Reverse TSA for this shoulder fracture. Rationale for this decision discussed with the patient who agrees with the plan.  Plan to take her to the OR this evening as she last ate early this AM.   CT of the face/head was negative for acute injury or fracture  Verlee Rossetti 05/04/2023, 3:54 PM

## 2023-05-04 NOTE — Anesthesia Procedure Notes (Signed)
Anesthesia Regional Block: Interscalene brachial plexus block   Pre-Anesthetic Checklist: , timeout performed,  Correct Patient, Correct Site, Correct Laterality,  Correct Procedure, Correct Position, site marked,  Risks and benefits discussed,  Surgical consent,  Pre-op evaluation,  At surgeon's request and post-op pain management  Laterality: Right  Prep: chloraprep       Needles:  Injection technique: Single-shot  Needle Type: Echogenic Stimulator Needle     Needle Length: 9cm  Needle Gauge: 21     Additional Needles:   Procedures:,,,, ultrasound used (permanent image in chart),,    Narrative:  Start time: 05/04/2023 5:15 PM End time: 05/04/2023 5:20 PM Injection made incrementally with aspirations every 5 mL.  Performed by: Personally  Anesthesiologist: Shelton Silvas, MD  Additional Notes: Discussed risks and benefits of the nerve block in detail, including but not limited vascular injury, permanent nerve damage and infection.   Patient tolerated the procedure well. Local anesthetic introduced in an incremental fashion under minimal resistance after negative aspirations. No paresthesias were elicited. After completion of the procedure, no acute issues were identified and patient continued to be monitored by RN.

## 2023-05-04 NOTE — ED Notes (Signed)
Unsuccessful IV attempt x 3 

## 2023-05-05 ENCOUNTER — Encounter (HOSPITAL_COMMUNITY): Payer: Self-pay | Admitting: Orthopedic Surgery

## 2023-05-05 DIAGNOSIS — S42291A Other displaced fracture of upper end of right humerus, initial encounter for closed fracture: Principal | ICD-10-CM

## 2023-05-05 DIAGNOSIS — W19XXXA Unspecified fall, initial encounter: Secondary | ICD-10-CM

## 2023-05-05 LAB — CBC
HCT: 35.1 % — ABNORMAL LOW (ref 36.0–46.0)
Hemoglobin: 11 g/dL — ABNORMAL LOW (ref 12.0–15.0)
MCH: 27.4 pg (ref 26.0–34.0)
MCHC: 31.3 g/dL (ref 30.0–36.0)
MCV: 87.5 fL (ref 80.0–100.0)
Platelets: 212 10*3/uL (ref 150–400)
RBC: 4.01 MIL/uL (ref 3.87–5.11)
RDW: 14.1 % (ref 11.5–15.5)
WBC: 11.3 10*3/uL — ABNORMAL HIGH (ref 4.0–10.5)
nRBC: 0 % (ref 0.0–0.2)

## 2023-05-05 LAB — COMPREHENSIVE METABOLIC PANEL
ALT: 23 U/L (ref 0–44)
AST: 28 U/L (ref 15–41)
Albumin: 3.7 g/dL (ref 3.5–5.0)
Alkaline Phosphatase: 62 U/L (ref 38–126)
Anion gap: 12 (ref 5–15)
BUN: 17 mg/dL (ref 8–23)
CO2: 21 mmol/L — ABNORMAL LOW (ref 22–32)
Calcium: 9 mg/dL (ref 8.9–10.3)
Chloride: 104 mmol/L (ref 98–111)
Creatinine, Ser: 0.88 mg/dL (ref 0.44–1.00)
GFR, Estimated: >60 mL/min (ref >60)
Glucose, Bld: 191 mg/dL — ABNORMAL HIGH (ref 70–99)
Potassium: 4.3 mmol/L (ref 3.5–5.1)
Sodium: 137 mmol/L (ref 135–145)
Total Bilirubin: 0.3 mg/dL (ref 0.3–1.2)
Total Protein: 7.3 g/dL (ref 6.5–8.1)

## 2023-05-05 NOTE — Evaluation (Signed)
Occupational Therapy Evaluation Patient Details Name: Kristy Gill MRN: [id number] DOB: [birth year] Today's Date: 05/05/2023   History of Present Illness 72 y.o. female  presented after  mechanical fall and landed on her right shoulder. ortho  consulted and pt is now s/p rRTSA on 05/04/23.    PMH: rheumatoid arthritis, essential hypertension, hyperlipidemia, osteoporosis, osteoarthritis, hypothyroidism, obstructive sleep apnea, paroxysmal atrial fibrillation, GERD   Clinical Impression   Pt presents at min A level of function with UB and LB ADLs, Mod I with toilet/shower transfers, Mod I with functional mobility/walking with no AD required. Pt with R UE immobilized in sling and NWB. Pt and her daughter educated on sling wear, shoulder protocol per surgeon and compensatory ADL techniques. All education completed and no further acute OT services are indicated at this time. Pt eager to d/c home     Recommendations for follow up therapy are one component of a multi-disciplinary discharge planning process, led by the attending physician.  Recommendations may be updated based on patient status, additional functional criteria and insurance authorization.   Assistance Recommended at Discharge Intermittent Supervision/Assistance  Patient can return home with the following A lot of help with bathing/dressing/bathroom;Assistance with cooking/housework;Assist for transportation    Functional Status Assessment  Patient has had a recent decline in their functional status and demonstrates the ability to make significant improvements in function in a reasonable and predictable amount of time.  Equipment Recommendations  None recommended by OT    Recommendations for Other Services       Precautions / Restrictions Precautions Precautions: Fall;Shoulder Type of Shoulder Precautions: wrist and hand AROM ok; no PROM/AROM shoulder Shoulder Interventions: Shoulder sling/immobilizer Precaution Booklet Issued:  Yes (comment) Precaution Comments: NWB, no P/AROM R shoulder, sling Required Braces or Orthoses: Sling Restrictions Weight Bearing Restrictions: Yes RUE Weight Bearing: Non weight bearing      Mobility Bed Mobility                    Transfers                          Balance Overall balance assessment: Needs assistance Sitting-balance support: Feet supported, No upper extremity supported Sitting balance-Leahy Scale: Good     Standing balance support: No upper extremity supported, During functional activity                               ADL either performed or assessed with clinical judgement   ADL Overall ADL's : Needs assistance/impaired Eating/Feeding: Set up;Independent   Grooming: Modified independent;Standing   Upper Body Bathing: Minimal assistance;With caregiver independent assisting   Lower Body Bathing: Minimal assistance;With caregiver independent assisting   Upper Body Dressing : Minimal assistance;With caregiver independent assisting   Lower Body Dressing: Minimal assistance;With caregiver independent assisting   Toilet Transfer: Modified Independent   Toileting- Clothing Manipulation and Hygiene: Supervision/safety   Tub/ Engineer, structural: Supervision/safety   Functional mobility during ADLs: Supervision/safety;Modified independent General ADL Comments: educated pt on compensatory ADL techiques     Vision Ability to See in Adequate Light: 0 Adequate Patient Visual Report: No change from baseline       Perception     Praxis      Pertinent Vitals/Pain Pain Assessment Pain Assessment: 0-10 Pain Score: 4  Pain Location: right shoulder Pain Descriptors / Indicators: Discomfort Pain Intervention(s): Monitored during session, Repositioned     Hand  Dominance Left   Extremity/Trunk Assessment Upper Extremity Assessment Upper Extremity Assessment: RUE deficits/detail RUE: Unable to fully assess due to  immobilization   Lower Extremity Assessment Lower Extremity Assessment: Defer to PT evaluation   Cervical / Trunk Assessment Cervical / Trunk Assessment: Normal   Communication Communication Communication: No difficulties   Cognition Arousal/Alertness: Awake/alert Behavior During Therapy: WFL for tasks assessed/performed Overall Cognitive Status: Within Functional Limits for tasks assessed                                       General Comments       Exercises     Shoulder Instructions      Home Living Family/patient expects to be discharged to:: Private residence Living Arrangements: Children Available Help at Discharge: Family;Available PRN/intermittently Type of Home: House Home Access: Ramped entrance     Home Layout: Multi-level;Full bath on main level;Able to live on main level with bedroom/bathroom;Laundry or work area in basement     [employer] Shower/Tub: Chief Strategy Officer: Standard     Home Equipment: Rollator (4 wheels);Grab bars - toilet;Grab bars - tub/shower   Additional Comments: son and dtr assisting; pt is a retired Community education officer Prior Level of Function : Independent/Modified Independent               ADLs Comments: Ind, with ADLs, IADLs, home mgt, cooking, drives        OT Problem List: Impaired UE functional use;Pain;Decreased range of motion;Decreased activity tolerance      OT Treatment/Interventions:      OT Goals(Current goals can be found in the care plan section) Acute Rehab OT Goals Patient Stated Goal: go home  OT Frequency:      Co-evaluation              AM-PAC OT "6 Clicks" Daily Activity     Outcome Measure Help from another person eating meals?: A Little Help from another person taking care of personal grooming?: A Little Help from another person toileting, which includes using toliet, bedpan, or urinal?: A Little Help from another person bathing  (including washing, rinsing, drying)?: A Little Help from another person to put on and taking off regular upper body clothing?: A Little Help from another person to put on and taking off regular lower body clothing?: A Little 6 Click Score: 18   End of Session    Activity Tolerance: Patient tolerated treatment well Patient left: in chair;with call bell/phone within reach  OT Visit Diagnosis: Muscle weakness (generalized) (M62.81);Pain Pain - Right/Left: Right Pain - part of body: Shoulder                Time: 1610-9604 OT Time Calculation (min): 43 min Charges:  OT General Charges $OT Visit: 1 Visit OT Evaluation $OT Eval Low Complexity: 1 Low OT Treatments $Self Care/Home Management : 8-22 mins $Therapeutic Activity: 8-22 mins   Henny, Strauch 05/05/2023, 1:15 PM

## 2023-05-05 NOTE — Progress Notes (Signed)
PROGRESS NOTE  Kristy Gill    DOB: 10-17-1951, 72 y.o.  WUJ:[id number]    Code Status: Full Code   DOA: 05/04/2023   LOS: 1   Brief hospital course  Kristy Gill is a 72 y.o. female with medical history significant of rheumatoid arthritis, essential hypertension, hyperlipidemia, osteoporosis, osteoarthritis, hypothyroidism, obstructive sleep apnea, paroxysmal atrial fibrillation, GERD.  They presented to ED after a fall at church resulting in right shoulder injury. In the ER workup showed proximal humeral fracture with displacement.   She was taken to the OR by ortho surgery.  [facility] was consulted for management of chronic conditions which were stable.   05/05/23 -stable. Evaluated by ortho surgery who cleared for discharge and outpatient follow up.   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 Pain is well controlled. Nerve-block still partially effective. Sensation and circulation intact distal to injury   hypothyroidism: Continue levothyroxine.   GERD: Continue with PPIs   history of rheumatoid arthritis, continue home regimen   multiple drug allergies: Patient has complex multiple drug allergies.  We will take that into consideration as we treat patient.    paroxysmal atrial fibrillation: Continue close monitoring at home treatment   essential hypertension: Blood pressure appears controlled.  Continue home regimen  Body mass index is 33.63 kg/m.  VTE ppx: enoxaparin (LOVENOX) injection 40 mg Start: 05/05/23 1000 SCD's Start: 05/04/23 2152 Place TED hose Start: 05/04/23  2152  Diet:     Diet   Diet Heart Room service appropriate? Yes; Fluid consistency: Thin    Subjective 05/05/23    Pt reports feeling well. She has no complaints. Regaining ROM in her R hand. Pain is well controlled.    Objective   Vitals:   05/04/23 2040 05/05/23 0112 05/05/23 0622 05/05/23 0622  BP: (!) 151/53 (!) 138/58 (!) 143/44   Pulse: (!) 105 79 72 78  Resp: 18 17 17    Temp: 98.8 F (37.1 C) 98.8 F (37.1 C) 98.4 F (36.9 C)   TempSrc: Oral Oral    SpO2: 95% 98% 95% 95%  Weight:      Height:        Intake/Output Summary (Last 24 hours) at 05/05/2023 0720 Last data filed at 05/05/2023 0630 Gross per 24 hour  Intake 1640.57 ml  Output 1250 ml  Net 390.57 ml   Filed Weights   05/04/23 1659  Weight: 80.7 kg     Physical Exam:  General: awake, alert, NAD HEENT: atraumatic, clear conjunctiva, anicteric sclera, MMM, hearing grossly normal Respiratory: normal respiratory effort. Cardiovascular: quick capillary refill Nervous: A&O x3. normal speech Extremities: R hand with decreased strength and ROM of fingers. Sensation intact. Warm to touch with quick cap refill distal to injury Skin: dry, intact, normal temperature, normal color. No rashes, lesions or ulcers on exposed skin Psychiatry: normal mood, congruent affect  Labs   I have personally reviewed the following labs and imaging studies CBC    Component Value Date/Time   WBC 11.3 (H) 05/05/2023 0329   RBC 4.01 05/05/2023 0329   HGB 11.0 (L) 05/05/2023 0329   HGB 13.7 03/10/2020 1025  HCT 35.1 (L) 05/05/2023 0329   HCT 41.4 03/10/2020 1025   PLT 212 05/05/2023 0329   MCV 87.5 05/05/2023 0329   MCV 88 03/10/2020 1025   MCH 27.4 05/05/2023 0329   MCHC 31.3 05/05/2023 0329   RDW 14.1 05/05/2023 0329   RDW 13.7 03/10/2020 1025   LYMPHSABS 2.0 03/10/2020 1025   MONOABS 0.6 10/30/2019 1415   EOSABS 0.2 03/10/2020 1025   BASOSABS 0.1 03/10/2020 1025      Latest Ref Rng & Units 05/05/2023    3:29 AM  05/04/2023   10:18 PM 03/10/2020   10:25 AM  BMP  Glucose 70 - 99 mg/dL 951   884   BUN 8 - 23 mg/dL 17   22   Creatinine 1.66 - 1.00 mg/dL 0.63  0.16  0.10   BUN/Creat Ratio 12 - 28   27   Sodium 135 - 145 mmol/L 137   136   Potassium 3.5 - 5.1 mmol/L 4.3   4.7   Chloride 98 - 111 mmol/L 104   100   CO2 22 - 32 mmol/L 21   22   Calcium 8.9 - 10.3 mg/dL 9.0   93.2     DG Shoulder Right Port  Result Date: 05/04/2023 CLINICAL DATA:  [id number].  Status right shoulder replacement. EXAM: RIGHT SHOULDER - 1 VIEW COMPARISON:  Preoperative study earlier today at 12:35 p.m. FINDINGS: Interval resection of the proximal right humerus to the surgical neck with insertion of reverse arthroplasty hardware. No periprosthetic fracture is seen, no prosthetic dislocation. There are overlying skin staples anteriorly. There is osteopenia and moderate degenerative change of the [facility] joint. IMPRESSION: Interval resection of the proximal right humerus to the surgical neck with insertion of reverse arthroplasty hardware. No periprosthetic fracture or dislocation. No acute hardware complication is evident. Electronically Signed   By: Almira Bar M.D.   On: 05/04/2023 22:09   CT Maxillofacial Wo Contrast  Result Date: 05/04/2023 CLINICAL DATA:  Trauma, fall EXAM: CT MAXILLOFACIAL WITHOUT CONTRAST TECHNIQUE: Multidetector CT imaging of the maxillofacial structures was performed. Multiplanar CT image reconstructions were also generated. RADIATION DOSE REDUCTION: This exam was performed according to the departmental dose-optimization program which includes automated exposure control, adjustment of the mA and/or kV according to patient size and/or use of iterative reconstruction technique. COMPARISON:  None Available. FINDINGS: Osseous: No recent displaced fractures are seen. There is slight motion artifact limiting evaluation of right side of mandible. Orbits: Optic globes are symmetrical. Retrobulbar soft tissues are  unremarkable. Sinuses: Unremarkable. Soft tissues: Unremarkable. Limited intracranial: Unremarkable. IMPRESSION: No recent fracture is seen. There are no air-fluid levels in paranasal sinuses. No focal abnormalities are seen in orbits. Electronically Signed   By: Ernie Avena M.D.   On: 05/04/2023 14:33   CT Cervical Spine Wo Contrast  Result Date: 05/04/2023 CLINICAL DATA:  Trauma, fall EXAM: CT CERVICAL SPINE WITHOUT CONTRAST TECHNIQUE: Multidetector CT imaging of the cervical spine was performed without intravenous contrast. Multiplanar CT image reconstructions were also generated. RADIATION DOSE REDUCTION: This exam was performed according to the departmental dose-optimization program which includes automated exposure control, adjustment of the mA and/or kV according to patient size and/or use of iterative reconstruction technique. COMPARISON:  MR cervical spine done on 10/31/2019 FINDINGS: Alignment: Alignment of posterior margins of vertebral bodies appears normal. Skull base and vertebrae: No recent fracture is seen. Degenerative changes are noted in cervical spine, most severe from C4-C7 levels. Soft tissues and spinal canal: There is extrinsic pressure  over the ventral margin of thecal sac caused by posterior bony spurs from C4-C7 levels, most severe at C5-C6 and C6-C7 levels with spinal stenosis. Disc levels: There is mild encroachment of neural foramina at C3-C4 level. Moderate to marked encroachment of neural foramina is seen at C5-C6 and C6-C7 levels. Upper chest: Visualized apical portions of both lungs are unremarkable. Other: Thyroid is smaller than usual in size. IMPRESSION: No recent fracture is seen. Cervical spondylosis with spinal stenosis and encroachment of neural foramina at multiple levels as described in the body of the report. Electronically Signed   By: Ernie Avena M.D.   On: 05/04/2023 14:24   CT Head Wo Contrast  Result Date: 05/04/2023 CLINICAL DATA:  Trauma,  fall EXAM: CT HEAD WITHOUT CONTRAST TECHNIQUE: Contiguous axial images were obtained from the base of the skull through the vertex without intravenous contrast. RADIATION DOSE REDUCTION: This exam was performed according to the departmental dose-optimization program which includes automated exposure control, adjustment of the mA and/or kV according to patient size and/or use of iterative reconstruction technique. COMPARISON:  10/30/2019 FINDINGS: Brain: No acute intracranial findings are seen. There are no signs of bleeding within the cranium. Ventricles are nondilated. Cortical sulci are prominent. Vascular: Scattered arterial calcifications are seen. Skull: Unremarkable. Sinuses/Orbits: Unremarkable. Other: None. IMPRESSION: No acute intracranial findings are seen in noncontrast CT brain. Atrophy. Electronically Signed   By: Ernie Avena M.D.   On: 05/04/2023 14:19   DG Knee Complete 4 Views Left  Result Date: 05/04/2023 CLINICAL DATA:  Trauma, fall EXAM: LEFT KNEE - COMPLETE 4+ VIEW COMPARISON:  None Available. FINDINGS: No fracture or dislocation is seen. There is no effusion in suprapatellar bursa. Tiny bony spurs are seen in the medial compartment. IMPRESSION: No fracture or dislocation is seen in left knee. Electronically Signed   By: Ernie Avena M.D.   On: 05/04/2023 13:23   DG Wrist Complete Right  Result Date: 05/04/2023 CLINICAL DATA:  Trauma, fall EXAM: RIGHT WRIST - COMPLETE 3+ VIEW COMPARISON:  None Available. FINDINGS: Chondrocalcinosis is seen. No recent displaced fracture or dislocation is seen. Small smoothly marginated calcifications adjacent to scaphoid may be residual from previous injury. Degenerative changes are noted in first carpometacarpal joint. Bony spurs are seen in interphalangeal joint of the thumb. IMPRESSION: No recent fracture or dislocation is seen. Degenerative changes are noted in multiple joints. Electronically Signed   By: Ernie Avena M.D.   On:  05/04/2023 13:16   DG Hand Complete Right  Result Date: 05/04/2023 CLINICAL DATA:  Trauma, fall EXAM: RIGHT HAND - COMPLETE 3+ VIEW COMPARISON:  None Available. FINDINGS: No recent fracture or dislocation is seen in right hand. There is previous arthroplasty in second metacarpophalangeal joint. There is surgical fusion in first metacarpophalangeal joint. Degenerative changes are noted in first carpometacarpal joint and multiple interphalangeal joints. There are faint calcifications in costal cartilage is seen right wrist. There are small smoothly marginated calcifications adjacent to scaphoid. These findings may be residual from previous injury and degenerative arthritis. IMPRESSION: No recent fracture or dislocation is seen in right hand. Other findings as described above. Electronically Signed   By: Ernie Avena M.D.   On: 05/04/2023 13:14   DG Shoulder Right  Result Date: 05/04/2023 CLINICAL DATA:  Trauma, fall EXAM: RIGHT SHOULDER - 2+ VIEW COMPARISON:  None Available. FINDINGS: Recent comminuted fracture is seen in the head and neck of proximal right humerus. There is medial and anterior displacement of distal major fracture fragment. There are  metallic densities in proximal humerus suggesting previous surgical intervention. There is no definite dislocation. IMPRESSION: Comminuted displaced fracture is seen in the head and neck of the proximal right humerus. Electronically Signed   By: Ernie Avena M.D.   On: 05/04/2023 13:12   DG Elbow Complete Right  Result Date: 05/04/2023 CLINICAL DATA:  Trauma, fall EXAM: RIGHT ELBOW - COMPLETE 3+ VIEW COMPARISON:  None Available. FINDINGS: Study is limited due to less than optimal positioning. As far as seen, no recent fracture or dislocation is seen. IMPRESSION: No displaced fracture or dislocation is seen limited views of the right elbow. Electronically Signed   By: Ernie Avena M.D.   On: 05/04/2023 13:10    Disposition Plan &  Communication  Patient status: Inpatient  Admitted From: Home Planned disposition location: Home Anticipated discharge date: 7/15 pending ortho clearance   Family Communication: none at bedside    Author: Leeroy Bock, DO [facility] 05/05/2023, 7:20 AM   Available by Epic secure chat 7AM-7PM. If 7PM-7AM, please contact night-coverage.  [facility] contact information found on [email or web address].

## 2023-05-05 NOTE — Progress Notes (Signed)
   Subjective: 1 Day Post-Op Procedure(s) (LRB): REVERSE SHOULDER ARTHROPLASTY (Right)  Pt doing well this morning Minimal feeling in the right upper extremity Denies any new symptoms overnight Ready for d/c home Patient reports pain as mild.  Objective:   VITALS:   Vitals:   05/05/23 0622 05/05/23 0951  BP:  (!) 120/54  Pulse: 78 81  Resp:  18  Temp:  97.9 F (36.6 C)  SpO2: 95% 97%    Right shoulder: incision healing well Dressing changed to aquacel Nv intact distally Sling in good position No signs of infection or dvt   LABS Recent Labs    05/04/23 2218 05/05/23 0329  HGB 11.7* 11.0*  HCT 36.6 35.1*  WBC 16.4* 11.3*  PLT 240 212    Recent Labs    05/04/23 2218 05/05/23 0329  NA  --  137  K  --  4.3  BUN  --  17  CREATININE 0.91 0.88  GLUCOSE  --  191*     Assessment/Plan: 1 Day Post-Op Procedure(s) (LRB): REVERSE SHOULDER ARTHROPLASTY (Right) D/c home today  F/u in 2 weeks in the office  Pt and family in agreement     Alphonsa Overall PA-C, MPAS [facility] now [facility]  [facility] [address], [place], [place] [address] Phone: [phone] [email or web address] Facebook  [employer]

## 2023-05-05 NOTE — Evaluation (Signed)
Physical Therapy Evaluation Patient Details Name: Kristy Gill MRN: [id number] DOB: [birth year] Today's Date: 05/05/2023  History of Present Illness  72 y.o. female  presented after  mechanical fall and landed on her right shoulder. ortho  consulted and pt is now s/p rRTSA on 05/04/23.    PMH: rheumatoid arthritis, essential hypertension, hyperlipidemia, osteoporosis, osteoarthritis, hypothyroidism, obstructive sleep apnea, paroxysmal atrial fibrillation, GERD  Clinical Impression  Patient evaluated by Physical Therapy with no further acute PT needs identified. All education has been completed and the patient has no further questions.  See below for any follow-up Physical Therapy or equipment needs. PT is signing off. Thank you for this referral.         Assistance Recommended at Discharge Intermittent Supervision/Assistance  If plan is discharge home, recommend the following:  Can travel by private vehicle  Assist for transportation;Help with stairs or ramp for entrance;Assistance with cooking/housework;A little help with bathing/dressing/bathroom        Equipment Recommendations None recommended by PT  Recommendations for Other Services       Functional Status Assessment Patient has had a recent decline in their functional status and demonstrates the ability to make significant improvements in function in a reasonable and predictable amount of time.     Precautions / Restrictions Precautions Precautions: Fall;Shoulder Type of Shoulder Precautions: wrist adn hand AROM ok; no PROM/AROM shoulder Shoulder Interventions: Shoulder sling/immobilizer Restrictions Weight Bearing Restrictions: Yes RUE Weight Bearing: Non weight bearing      Mobility  Bed Mobility               General bed mobility comments: pt standing in room gettign dressed on PT arrival    Transfers Overall transfer level: Modified independent Equipment used: None               General transfer  comment: bed and recliner    Ambulation/Gait Ambulation/Gait assistance: Supervision Gait Distance (Feet): 360 Feet Assistive device: None Gait Pattern/deviations: Step-through pattern, WFL(Within Functional Limits)       General Gait Details: grossly WFL, one standing rest d/t fatigue/dyspnea;  Stairs            Wheelchair Mobility     Tilt Bed    Modified Rankin (Stroke Patients Only)       Balance Overall balance assessment: Needs assistance Sitting-balance support: Feet supported, No upper extremity supported Sitting balance-Leahy Scale: Good     Standing balance support: No upper extremity supported, During functional activity   Standing balance comment: fair to good, NT to mod challenges-no overt LOB             High level balance activites: Turns, Head turns, Backward walking High Level Balance Comments: no LOB with above             Pertinent Vitals/Pain Pain Assessment Pain Assessment: Faces Faces Pain Scale: Hurts a little bit Pain Location: right shoulder Pain Descriptors / Indicators: Discomfort Pain Intervention(s): Limited activity within patient's tolerance, Monitored during session    Home Living Family/patient expects to be discharged to:: Private residence Living Arrangements: Children Available Help at Discharge: Family;Available PRN/intermittently Type of Home: House Home Access: Ramped entrance           Additional Comments: son and dtr assisting; pt is a retired Engineer, building services Prior Level of Function : Independent/Modified Independent                     Higher education careers adviser  Extremity/Trunk Assessment   Upper Extremity Assessment Upper Extremity Assessment: Defer to OT evaluation    Lower Extremity Assessment Lower Extremity Assessment: Overall WFL for tasks assessed       Communication   Communication: No difficulties  Cognition Arousal/Alertness: Awake/alert Behavior During Therapy:  WFL for tasks assessed/performed Overall Cognitive Status: Within Functional Limits for tasks assessed                                          General Comments      Exercises     Assessment/Plan    PT Assessment Patient does not need any further PT services  PT Problem List         PT Treatment Interventions      PT Goals (Current goals can be found in the Care Plan section)  Acute Rehab PT Goals PT Goal Formulation: All assessment and education complete, DC therapy    Frequency       Co-evaluation               AM-PAC PT "6 Clicks" Mobility  Outcome Measure Help needed turning from your back to your side while in a flat bed without using bedrails?: A Little Help needed moving from lying on your back to sitting on the side of a flat bed without using bedrails?: None Help needed moving to and from a bed to a chair (including a wheelchair)?: None Help needed standing up from a chair using your arms (e.g., wheelchair or bedside chair)?: None Help needed to walk in hospital room?: None Help needed climbing 3-5 steps with a railing? : A Little 6 Click Score: 22    End of Session Equipment Utilized During Treatment: Gait belt Activity Tolerance: Patient tolerated treatment well Patient left: with call bell/phone within reach;with family/visitor present;Other (comment) (EOB)   PT Visit Diagnosis: History of falling (Z91.81)    Time: 4098-1191 PT Time Calculation (min) (ACUTE ONLY): 10 min   Charges:   PT Evaluation $PT Eval Low Complexity: 1 Low   PT General Charges $$ ACUTE PT VISIT: 1 Visit         Kambrey Hagger, PT  Acute Rehab Dept [facility]) [phone]  05/05/2023   [facility] 05/05/2023, 10:59 AM

## 2023-05-05 NOTE — Plan of Care (Signed)
  Problem: Education: Goal: Knowledge of the prescribed therapeutic regimen will improve Outcome: Progressing   Problem: Activity: Goal: Ability to tolerate increased activity will improve Outcome: Progressing   Problem: Pain Management: Goal: Pain level will decrease with appropriate interventions Outcome: Progressing   Problem: Clinical Measurements: Goal: Ability to maintain clinical measurements within normal limits will improve Outcome: Progressing   Problem: Safety: Goal: Ability to remain free from injury will improve Outcome: Progressing

## 2023-05-05 NOTE — Discharge Summary (Signed)
In most cases prophylactic antibiotics for Dental procdeures after total joint surgery are not necessary.  Exceptions are as follows:  1. History of prior total joint infection  2. Severely immunocompromised (Organ Transplant, cancer chemotherapy, Rheumatoid biologic meds such as Humera)  3. Poorly controlled diabetes (A1C &gt; 8.0, blood glucose over 200)  If you have one of these conditions, contact your surgeon for an antibiotic prescription, prior to your dental procedure. Orthopedic Discharge Summary        Physician Discharge Summary  Patient ID: Kristy Gill MRN: [id number] DOB/AGE: 12/31/1950 72 y.o.  Admit date: 05/04/2023 Discharge date: 05/05/2023   Procedures:  Procedure(s) (LRB): REVERSE SHOULDER ARTHROPLASTY (Right)  Attending Physician:  Dr. Malon Kindle  Admission Diagnoses:   right proximal humerus fracture  Discharge Diagnoses:  right proximal humerus fracture   Past Medical History:  Diagnosis Date   Arthritis    Breast disorder    Clawfoot, acquired    Diabetes mellitus without complication (HCC)    Gall stones    Gout    Hypercalcemia    Hyperlipidemia    Hypertension    Hypertensive heart disease without heart failure    Obesity    Osteoporosis    Pancreatitis    Primary osteoarthritis    Rotator cuff rupture    Thyroid disease    hypothryoidism    PCP: Aliene Beams, MD   Discharged Condition: good  Hospital Course:  Patient underwent the above stated procedure on 05/04/2023. Patient tolerated the procedure well and brought to the recovery room in good condition and subsequently to the floor. Patient had an uncomplicated hospital course and was stable for discharge.   Disposition: Discharge disposition: 01-Home or Self Care      with follow up in 2 weeks    Follow-up Information     Beverely Low, MD. Call in 2 week(s).   Specialty: Orthopedic Surgery Why: call [phone] for appt Contact information: [address] [place] [address] [phone]                 Dental Antibiotics:  In most cases prophylactic antibiotics for Dental procdeures after total joint surgery are not necessary.  Exceptions are as follows:  1. History of prior total joint infection  2. Severely immunocompromised (Organ Transplant, cancer chemotherapy, Rheumatoid biologic meds such as Humera)  3. Poorly controlled diabetes (A1C &gt; 8.0, blood glucose over 200)  If you have one of these conditions, contact your surgeon for an antibiotic prescription, prior to your dental procedure.  Discharge Instructions     Call MD / Call 911   Complete by: As directed    If you experience chest pain or shortness of breath, CALL 911 and be transported to the hospital emergency room.  If you develope a fever above 101 F, pus (white drainage) or increased drainage or redness at the wound, or calf pain, call your surgeon's office.   Constipation Prevention   Complete by: As directed    Drink plenty of fluids.  Prune juice may be helpful.  You may use a stool softener, such as Colace (over the counter) 100 mg twice a day.  Use MiraLax (over the counter) for constipation as needed.   Diet - low sodium heart healthy   Complete by: As directed    Increase activity slowly as tolerated   Complete by: As directed    Post-operative opioid taper instructions:   Complete by: As directed    POST-OPERATIVE  OPIOID TAPER INSTRUCTIONS: It is important to wean off of your opioid medication as soon as possible. If you do not need pain medication after your surgery it is ok to stop day one. Opioids include: Codeine, Hydrocodone(Norco, Vicodin), Oxycodone(Percocet, oxycontin) and hydromorphone amongst others.  Long term and even short term use of opiods can cause: Increased pain response Dependence Constipation Depression Respiratory depression And more.  Withdrawal symptoms can include Flu like  symptoms Nausea, vomiting And more Techniques to manage these symptoms Hydrate well Eat regular healthy meals Stay active Use relaxation techniques(deep breathing, meditating, yoga) Do Not substitute Alcohol to help with tapering If you have been on opioids for less than two weeks and do not have pain than it is ok to stop all together.  Plan to wean off of opioids This plan should start within one week post op of your joint replacement. Maintain the same interval or time between taking each dose and first decrease the dose.  Cut the total daily intake of opioids by one tablet each day Next start to increase the time between doses. The last dose that should be eliminated is the evening dose.          Allergies as of 05/05/2023       Reactions   Amlodipine Shortness Of Breath, Swelling, Other (See Comments)   Minor shortness of breath, fatigue, headache, edema, SOB, fatigue, HA   Cefaclor Shortness Of Breath, Itching, Rash, Other (See Comments)   Bronchospasm and wheezing, Tolerated Ancef 05/04/23   Diclofenac Shortness Of Breath, Swelling, Other (See Comments)   Reaction to gel - throat swelling and respiratory distress   Metformin And Related Shortness Of Breath, Rash, Other (See Comments)   Fatigue, scalp rash, shakiness   Penicillin V Potassium Shortness Of Breath, Other (See Comments)   Other Reaction(s): throat tightness, resp. distress   Morphine And Codeine Nausea And Vomiting   Projectile vomiting   Astaxanthin    Other Reaction(s): Unknown   Atorvastatin Other (See Comments)   Severe joint pain and abdominal pain Other Reaction(s): Myositis, Not available   Boswellia Hives   Other Reaction(s): generalized hives   Cephalosporins Other (See Comments)   Unknown reaction   Clindamycin    Other Reaction(s): Not available, severe diarrhea   Clindamycin/lincomycin Diarrhea   Severe diarrhea   Etodolac Other (See Comments)   Stomach and abdominal pain, mouth  ulcers Other Reaction(s): stomach and abdominal pain, mouth ulcers   Gemfibrozil Itching, Nausea And Vomiting, Other (See Comments)   Muscle pain, hot flashes Other Reaction(s): Not available   Hydrocodone-acetaminophen Nausea And Vomiting   Metformin    Other Reaction(s): fatigue, SOB, scalp rash, shakiness, Not available   Methotrexate Nausea And Vomiting   Other Reaction(s): fatigue, GI sx, HA, muscle pain, face and scalp rash, mouth ulcers, Not available   Methotrexate Derivatives Other (See Comments)   Fatigue/ GI symptoms/ headache/ muscle pain/ face and scalp rash, mouth ulcers   Methylprednisolone Other (See Comments)   Stomach pain/ tachycardia Other Reaction(s): Not available   Niacin    Other Reaction(s): flushing, heartburn, insomnia, joint pain, increased HR, Not available   Niacin And Related Other (See Comments)   Flushing, heartburn, insomnia, joint pain, elevated heart rate   Pravastatin Other (See Comments)   Muscle pain Other Reaction(s): Not available   Rayos [prednisone] Other (See Comments)   Stomach pain, tachycardia, muscle spasms, parathesia   Simvastatin Diarrhea, Other (See Comments)   Abdominal pain, Other Reaction(s): abdominal pain  and diarrhea, Muscle pain Other Reaction(s): Not available   Sulfacetamide Sodium-sulfur    Other Reaction(s): Unknown   Vitamin B12    Other Reaction(s): Unknown   Zoster Vac Recomb Adjuvanted    Other Reaction(s): severe yeast infection   Ampicillin-sulbactam Sodium Itching, Other (See Comments), Rash   Blisters on scalp Other Reaction(s): blisters on scalp, pruritus, Not available, Unknown   Baclofen Rash   Petechial face rash Other Reaction(s): Not available, petechial face rash, Unknown   Etanercept Other (See Comments), Rash   Redness, swelling at injection site, tightness in throat w/ mild respiratory distress, muscle spasms, bell's palsy, discoid lupus Other Reaction(s): Eruption, Cramp, Neuropathy, Eruption,  Cramp, Neuropathy, Not available, redness and swelling at sites, tightness in throat, fever/chills, mild resp. distress, Unknown   Ezetimibe Cough, Other (See Comments), Rash   Headache, itchy rash, fatigue, dry mouth, dry cough Other Reaction(s): Eruption, Fatigue, Headache, Eruption, Fatigue, Headache, HA, pruritic rash, dry mouth, dry cough, Not available, Unknown   Golimumab Hives, Itching, Rash   Other Reaction(s): Not available, pruritic rash and hives   Hydroxychloroquine Itching, Other (See Comments), Rash   Blisters on scalp Other Reaction(s): blisters on scalp, pruritus, Not available, Unknown   Ibuprofen Rash, Swelling   Swollen face Other Reaction(s): Not available, rash, swollen face, Unknown   Leflunomide Nausea And Vomiting, Other (See Comments), Rash   Chest pain, tachycardia, elevated bp Other Reaction(s): nausea, CP, tachycardia, muscle pain, rash, Not available, Unknown   Lisinopril Cough   Other Reaction(s): Cough, Unknown   Other Itching, Rash, Other (See Comments)   Monsel's Solution =  rash, itching   Penicillins Rash   Did it involve swelling of the face/tongue/throat, SOB, or low BP? YES Did it involve sudden or severe rash/hives, skin peeling, or any reaction on the inside of your mouth or nose? NO Did you need to seek medical attention at a hospital or doctor's office? YES When did it last happen?   04/2020 If all above answers are "NO", may proceed with cephalosporin use. Other Reaction(s): Unknown   Sulfa Antibiotics Other (See Comments), Rash   Unknown reaction   Tocilizumab Other (See Comments), Rash   Facial rash, rash at injection site, rectal bleeding, headache Other Reaction(s): facial rash, rash at inj site, rectal bleeding, HA, Not available, Unknown   Wound Dressing Adhesive Other (See Comments), Rash        Medication List     TAKE these medications    acetaminophen 500 MG tablet Commonly known as: TYLENOL Take 1,000 mg by mouth daily  as needed (pain).   acetaminophen 650 MG CR tablet Commonly known as: TYLENOL Take 1,300 mg by mouth at bedtime.   albuterol 108 (90 Base) MCG/ACT inhaler Commonly known as: VENTOLIN HFA Inhale 2 puffs into the lungs every 4-6 hours as needed for cough, wheeze, shortness of breath or chest tightness.   allopurinol 100 MG tablet Commonly known as: ZYLOPRIM Take 100 mg by mouth daily with supper.   allopurinol 100 MG tablet Commonly known as: ZYLOPRIM Take 2 tablets by mouth daily.   calcium-vitamin D 500-200 MG-UNIT Tabs tablet Commonly known as: OSCAL WITH D Take 1 tablet by mouth 2 (two) times daily.   carboxymethylcellulose 0.5 % Soln Commonly known as: REFRESH PLUS INSTILL 1 DROP IN EACH EYE FOUR TIMES A DAY   colchicine 0.6 MG tablet Take 0.6 mg by mouth daily as needed (for flare up).   Cranberry 500 MG Caps Take 500 mg by mouth  daily after breakfast.   cyclobenzaprine 10 MG tablet Commonly known as: FLEXERIL Take 10 mg by mouth See admin instructions. Take one tablet (10 mg) by mouth daily at bedtime, may also take one tablet (10 mg) daily as needed for pain   cyclobenzaprine 5 MG tablet Commonly known as: FLEXERIL Take 1 tablet by mouth daily as needed.   diphenhydrAMINE 12.5 MG/5ML liquid Commonly known as: BENADRYL Take 12.5 mg by mouth daily as needed for itching or allergies.   Fish Oil 1000 MG Caps Take 1 capsule by mouth at bedtime.   levothyroxine 112 MCG tablet Commonly known as: SYNTHROID Take 112 mcg by mouth daily before breakfast.   levothyroxine 112 MCG tablet Commonly known as: SYNTHROID TAKE ONE TABLET BY MOUTH EVERY DAY FOR THYROID   losartan 25 MG tablet Commonly known as: COZAAR losartan 25 mg tablet  Take 1 tablet every day by oral route.   losartan 25 MG tablet Commonly known as: COZAAR TAKE ONE TABLET BY MOUTH ONCE EVERY DAY FOR BLOOD PRESSURE   PROBIOTIC PO Take 1 tablet by mouth 3 (three) times a week.   RABEprazole 20  MG tablet Commonly known as: ACIPHEX TAKE ONE TABLET BY MOUTH IN THE MORNING AS NEEDED   traMADol 50 MG tablet Commonly known as: ULTRAM Take 1 tablet (50 mg total) by mouth every 8 (eight) hours as needed for moderate pain or severe pain (pain). What changed: reasons to take this   TURMERIC PO Take 1,500 mg by mouth daily.   Vitamin D-3 125 MCG (5000 UT) Tabs Take 5,000 Units by mouth at bedtime.   Cholecalciferol 25 MCG (1000 UT) tablet Take 2 tablets by mouth daily.          Signed: Thea Gist 05/05/2023, 9:59 AM  [facility] is now [employer] [address], [address], [place], [place] [address] Phone: [phone] Facebook  Instagram  [employer]

## 2023-05-20 DIAGNOSIS — Z4789 Encounter for other orthopedic aftercare: Secondary | ICD-10-CM | POA: Diagnosis not present

## 2023-05-21 DIAGNOSIS — M25511 Pain in right shoulder: Secondary | ICD-10-CM | POA: Diagnosis not present

## 2023-06-20 DIAGNOSIS — Z4789 Encounter for other orthopedic aftercare: Secondary | ICD-10-CM | POA: Diagnosis not present

## 2023-07-02 DIAGNOSIS — M25532 Pain in left wrist: Secondary | ICD-10-CM | POA: Diagnosis not present

## 2023-07-02 DIAGNOSIS — M13831 Other specified arthritis, right wrist: Secondary | ICD-10-CM | POA: Diagnosis not present

## 2023-07-02 DIAGNOSIS — M25531 Pain in right wrist: Secondary | ICD-10-CM | POA: Diagnosis not present

## 2023-08-21 DIAGNOSIS — H9313 Tinnitus, bilateral: Secondary | ICD-10-CM | POA: Diagnosis not present

## 2023-08-21 DIAGNOSIS — H903 Sensorineural hearing loss, bilateral: Secondary | ICD-10-CM | POA: Diagnosis not present

## 2023-08-27 DIAGNOSIS — E559 Vitamin D deficiency, unspecified: Secondary | ICD-10-CM | POA: Diagnosis not present

## 2023-08-27 DIAGNOSIS — M8580 Other specified disorders of bone density and structure, unspecified site: Secondary | ICD-10-CM | POA: Diagnosis not present

## 2023-08-27 DIAGNOSIS — E1129 Type 2 diabetes mellitus with other diabetic kidney complication: Secondary | ICD-10-CM | POA: Diagnosis not present

## 2023-08-27 DIAGNOSIS — R809 Proteinuria, unspecified: Secondary | ICD-10-CM | POA: Diagnosis not present

## 2023-08-27 DIAGNOSIS — Z1231 Encounter for screening mammogram for malignant neoplasm of breast: Secondary | ICD-10-CM | POA: Diagnosis not present

## 2023-08-27 DIAGNOSIS — E119 Type 2 diabetes mellitus without complications: Secondary | ICD-10-CM | POA: Diagnosis not present

## 2023-08-27 DIAGNOSIS — E063 Autoimmune thyroiditis: Secondary | ICD-10-CM | POA: Diagnosis not present

## 2023-08-27 DIAGNOSIS — Z0000 Encounter for general adult medical examination without abnormal findings: Secondary | ICD-10-CM | POA: Diagnosis not present

## 2023-08-27 DIAGNOSIS — G4733 Obstructive sleep apnea (adult) (pediatric): Secondary | ICD-10-CM | POA: Diagnosis not present

## 2023-08-27 DIAGNOSIS — D649 Anemia, unspecified: Secondary | ICD-10-CM | POA: Diagnosis not present

## 2023-09-01 ENCOUNTER — Other Ambulatory Visit: Payer: Self-pay | Admitting: Family Medicine

## 2023-09-01 DIAGNOSIS — Z1231 Encounter for screening mammogram for malignant neoplasm of breast: Secondary | ICD-10-CM

## 2023-09-10 DIAGNOSIS — Z1211 Encounter for screening for malignant neoplasm of colon: Secondary | ICD-10-CM | POA: Diagnosis not present

## 2023-10-24 DIAGNOSIS — R195 Other fecal abnormalities: Secondary | ICD-10-CM | POA: Diagnosis not present

## 2023-11-05 DIAGNOSIS — M25522 Pain in left elbow: Secondary | ICD-10-CM | POA: Diagnosis not present

## 2023-11-05 DIAGNOSIS — M25531 Pain in right wrist: Secondary | ICD-10-CM | POA: Diagnosis not present

## 2023-11-05 DIAGNOSIS — M25521 Pain in right elbow: Secondary | ICD-10-CM | POA: Diagnosis not present

## 2023-11-05 DIAGNOSIS — M24131 Other articular cartilage disorders, right wrist: Secondary | ICD-10-CM | POA: Diagnosis not present

## 2023-11-24 ENCOUNTER — Ambulatory Visit
Admission: RE | Admit: 2023-11-24 | Discharge: 2023-11-24 | Disposition: A | Discharge status: Home / Self Care | Payer: No Typology Code available for payment source | Source: Ambulatory Visit | Attending: Family Medicine | Admitting: Family Medicine

## 2023-11-24 ENCOUNTER — Ambulatory Visit: Payer: No Typology Code available for payment source

## 2023-11-24 DIAGNOSIS — N958 Other specified menopausal and perimenopausal disorders: Secondary | ICD-10-CM | POA: Diagnosis not present

## 2023-11-24 DIAGNOSIS — M8588 Other specified disorders of bone density and structure, other site: Secondary | ICD-10-CM | POA: Diagnosis not present

## 2023-11-24 DIAGNOSIS — M8580 Other specified disorders of bone density and structure, unspecified site: Secondary | ICD-10-CM

## 2023-11-24 DIAGNOSIS — E2839 Other primary ovarian failure: Secondary | ICD-10-CM | POA: Diagnosis not present

## 2023-11-25 DIAGNOSIS — M7542 Impingement syndrome of left shoulder: Secondary | ICD-10-CM | POA: Diagnosis not present

## 2023-11-25 DIAGNOSIS — M25511 Pain in right shoulder: Secondary | ICD-10-CM | POA: Diagnosis not present

## 2024-01-16 DIAGNOSIS — Z09 Encounter for follow-up examination after completed treatment for conditions other than malignant neoplasm: Secondary | ICD-10-CM | POA: Diagnosis not present

## 2024-01-16 DIAGNOSIS — R195 Other fecal abnormalities: Secondary | ICD-10-CM | POA: Diagnosis not present

## 2024-01-16 DIAGNOSIS — D120 Benign neoplasm of cecum: Secondary | ICD-10-CM | POA: Diagnosis not present

## 2024-01-16 DIAGNOSIS — D123 Benign neoplasm of transverse colon: Secondary | ICD-10-CM | POA: Diagnosis not present

## 2024-01-16 DIAGNOSIS — K649 Unspecified hemorrhoids: Secondary | ICD-10-CM | POA: Diagnosis not present

## 2024-01-16 DIAGNOSIS — K5730 Diverticulosis of large intestine without perforation or abscess without bleeding: Secondary | ICD-10-CM | POA: Diagnosis not present

## 2024-01-16 DIAGNOSIS — Z860101 Personal history of adenomatous and serrated colon polyps: Secondary | ICD-10-CM | POA: Diagnosis not present

## 2024-01-16 DIAGNOSIS — D122 Benign neoplasm of ascending colon: Secondary | ICD-10-CM | POA: Diagnosis not present

## 2024-01-20 DIAGNOSIS — D122 Benign neoplasm of ascending colon: Secondary | ICD-10-CM | POA: Diagnosis not present

## 2024-01-20 DIAGNOSIS — D123 Benign neoplasm of transverse colon: Secondary | ICD-10-CM | POA: Diagnosis not present

## 2024-01-20 DIAGNOSIS — D120 Benign neoplasm of cecum: Secondary | ICD-10-CM | POA: Diagnosis not present

## 2024-01-27 DIAGNOSIS — Z96611 Presence of right artificial shoulder joint: Secondary | ICD-10-CM | POA: Diagnosis not present

## 2024-01-27 DIAGNOSIS — M25511 Pain in right shoulder: Secondary | ICD-10-CM | POA: Diagnosis not present

## 2024-02-09 ENCOUNTER — Encounter: Payer: Self-pay | Admitting: Radiology

## 2024-02-09 ENCOUNTER — Ambulatory Visit
Admission: RE | Admit: 2024-02-09 | Discharge: 2024-02-09 | Disposition: A | Discharge status: Home / Self Care | Payer: No Typology Code available for payment source | Source: Ambulatory Visit | Attending: Family Medicine | Admitting: Family Medicine

## 2024-02-09 DIAGNOSIS — Z1231 Encounter for screening mammogram for malignant neoplasm of breast: Secondary | ICD-10-CM | POA: Diagnosis not present

## 2024-02-11 DIAGNOSIS — M13831 Other specified arthritis, right wrist: Secondary | ICD-10-CM | POA: Diagnosis not present

## 2024-02-11 DIAGNOSIS — M65342 Trigger finger, left ring finger: Secondary | ICD-10-CM | POA: Diagnosis not present

## 2024-02-11 DIAGNOSIS — M65332 Trigger finger, left middle finger: Secondary | ICD-10-CM | POA: Diagnosis not present

## 2024-02-11 DIAGNOSIS — M25531 Pain in right wrist: Secondary | ICD-10-CM | POA: Diagnosis not present

## 2024-02-11 DIAGNOSIS — G5602 Carpal tunnel syndrome, left upper limb: Secondary | ICD-10-CM | POA: Diagnosis not present

## 2024-02-11 DIAGNOSIS — Z4789 Encounter for other orthopedic aftercare: Secondary | ICD-10-CM | POA: Diagnosis not present

## 2024-02-11 DIAGNOSIS — M24132 Other articular cartilage disorders, left wrist: Secondary | ICD-10-CM | POA: Diagnosis not present

## 2024-02-23 DIAGNOSIS — M25511 Pain in right shoulder: Secondary | ICD-10-CM | POA: Diagnosis not present

## 2024-02-24 DIAGNOSIS — L304 Erythema intertrigo: Secondary | ICD-10-CM | POA: Diagnosis not present

## 2024-02-24 DIAGNOSIS — D72829 Elevated white blood cell count, unspecified: Secondary | ICD-10-CM | POA: Diagnosis not present

## 2024-02-24 DIAGNOSIS — M159 Polyosteoarthritis, unspecified: Secondary | ICD-10-CM | POA: Diagnosis not present

## 2024-02-24 DIAGNOSIS — E1129 Type 2 diabetes mellitus with other diabetic kidney complication: Secondary | ICD-10-CM | POA: Diagnosis not present

## 2024-02-24 DIAGNOSIS — E063 Autoimmune thyroiditis: Secondary | ICD-10-CM | POA: Diagnosis not present

## 2024-03-10 DIAGNOSIS — M7662 Achilles tendinitis, left leg: Secondary | ICD-10-CM | POA: Diagnosis not present

## 2024-03-10 DIAGNOSIS — M1991 Primary osteoarthritis, unspecified site: Secondary | ICD-10-CM | POA: Diagnosis not present

## 2024-03-10 DIAGNOSIS — E669 Obesity, unspecified: Secondary | ICD-10-CM | POA: Diagnosis not present

## 2024-03-10 DIAGNOSIS — Z6831 Body mass index (BMI) 31.0-31.9, adult: Secondary | ICD-10-CM | POA: Diagnosis not present

## 2024-03-10 DIAGNOSIS — M0609 Rheumatoid arthritis without rheumatoid factor, multiple sites: Secondary | ICD-10-CM | POA: Diagnosis not present

## 2024-03-10 DIAGNOSIS — M1A09X0 Idiopathic chronic gout, multiple sites, without tophus (tophi): Secondary | ICD-10-CM | POA: Diagnosis not present

## 2024-03-10 DIAGNOSIS — M79671 Pain in right foot: Secondary | ICD-10-CM | POA: Diagnosis not present

## 2024-03-24 DIAGNOSIS — G5602 Carpal tunnel syndrome, left upper limb: Secondary | ICD-10-CM | POA: Diagnosis not present

## 2024-03-24 DIAGNOSIS — M65342 Trigger finger, left ring finger: Secondary | ICD-10-CM | POA: Diagnosis not present

## 2024-03-24 DIAGNOSIS — M24132 Other articular cartilage disorders, left wrist: Secondary | ICD-10-CM | POA: Diagnosis not present

## 2024-03-24 DIAGNOSIS — M24131 Other articular cartilage disorders, right wrist: Secondary | ICD-10-CM | POA: Diagnosis not present

## 2024-03-24 DIAGNOSIS — M13821 Other specified arthritis, right elbow: Secondary | ICD-10-CM | POA: Diagnosis not present

## 2024-03-24 DIAGNOSIS — Z4789 Encounter for other orthopedic aftercare: Secondary | ICD-10-CM | POA: Diagnosis not present

## 2024-03-24 DIAGNOSIS — M65332 Trigger finger, left middle finger: Secondary | ICD-10-CM | POA: Diagnosis not present

## 2024-04-28 DIAGNOSIS — H2513 Age-related nuclear cataract, bilateral: Secondary | ICD-10-CM | POA: Diagnosis not present

## 2024-04-28 DIAGNOSIS — H524 Presbyopia: Secondary | ICD-10-CM | POA: Diagnosis not present

## 2024-04-28 DIAGNOSIS — E119 Type 2 diabetes mellitus without complications: Secondary | ICD-10-CM | POA: Diagnosis not present

## 2024-04-30 DIAGNOSIS — L237 Allergic contact dermatitis due to plants, except food: Secondary | ICD-10-CM | POA: Diagnosis not present

## 2024-05-18 DIAGNOSIS — E063 Autoimmune thyroiditis: Secondary | ICD-10-CM | POA: Diagnosis not present

## 2024-07-23 DIAGNOSIS — E063 Autoimmune thyroiditis: Secondary | ICD-10-CM | POA: Diagnosis not present

## 2024-08-20 DIAGNOSIS — H903 Sensorineural hearing loss, bilateral: Secondary | ICD-10-CM | POA: Diagnosis not present

## 2024-09-24 DIAGNOSIS — E559 Vitamin D deficiency, unspecified: Secondary | ICD-10-CM | POA: Diagnosis not present

## 2024-09-24 DIAGNOSIS — E063 Autoimmune thyroiditis: Secondary | ICD-10-CM | POA: Diagnosis not present

## 2024-09-24 DIAGNOSIS — E1129 Type 2 diabetes mellitus with other diabetic kidney complication: Secondary | ICD-10-CM | POA: Diagnosis not present
